# Patient Record
Sex: Female | Born: 1980 | Race: Black or African American | Hispanic: No | Marital: Single | Smoking: Former smoker
Health system: Southern US, Community
[De-identification: ages and names within clinical notes are randomized; demographics above are authoritative.]

## PROBLEM LIST (undated history)

## (undated) DIAGNOSIS — T8859XA Other complications of anesthesia, initial encounter: Secondary | ICD-10-CM

## (undated) DIAGNOSIS — I10 Essential (primary) hypertension: Secondary | ICD-10-CM

## (undated) DIAGNOSIS — T4145XA Adverse effect of unspecified anesthetic, initial encounter: Secondary | ICD-10-CM

## (undated) HISTORY — PX: FOOT SURGERY: SHX648

## (undated) HISTORY — PX: TONSILLECTOMY: SUR1361

## (undated) SURGERY — Surgical Case
Anesthesia: *Unknown

---

## 2010-05-18 ENCOUNTER — Emergency Department (HOSPITAL_BASED_OUTPATIENT_CLINIC_OR_DEPARTMENT_OTHER)
Admission: EM | Admit: 2010-05-18 | Discharge: 2010-05-18 | Payer: Self-pay | Source: Home / Self Care | Admitting: Emergency Medicine

## 2011-02-13 ENCOUNTER — Encounter: Payer: Self-pay | Admitting: *Deleted

## 2011-02-13 ENCOUNTER — Emergency Department (INDEPENDENT_AMBULATORY_CARE_PROVIDER_SITE_OTHER): Payer: Medicaid Other

## 2011-02-13 ENCOUNTER — Other Ambulatory Visit: Payer: Self-pay

## 2011-02-13 ENCOUNTER — Emergency Department (HOSPITAL_BASED_OUTPATIENT_CLINIC_OR_DEPARTMENT_OTHER)
Admission: EM | Admit: 2011-02-13 | Discharge: 2011-02-14 | Disposition: A | Discharge status: Home / Self Care | Payer: Medicaid Other | Attending: Emergency Medicine | Admitting: Emergency Medicine

## 2011-02-13 DIAGNOSIS — I10 Essential (primary) hypertension: Secondary | ICD-10-CM | POA: Insufficient documentation

## 2011-02-13 DIAGNOSIS — R0789 Other chest pain: Secondary | ICD-10-CM

## 2011-02-13 DIAGNOSIS — Z79899 Other long term (current) drug therapy: Secondary | ICD-10-CM | POA: Insufficient documentation

## 2011-02-13 DIAGNOSIS — M549 Dorsalgia, unspecified: Secondary | ICD-10-CM | POA: Insufficient documentation

## 2011-02-13 DIAGNOSIS — R079 Chest pain, unspecified: Secondary | ICD-10-CM

## 2011-02-13 DIAGNOSIS — I517 Cardiomegaly: Secondary | ICD-10-CM

## 2011-02-13 HISTORY — DX: Essential (primary) hypertension: I10

## 2011-02-13 LAB — D-DIMER, QUANTITATIVE: D-Dimer, Quant: 0.7 ug/mL-FEU — ABNORMAL HIGH (ref 0.00–0.48)

## 2011-02-13 LAB — PROTIME-INR
INR: 1.07 (ref 0.00–1.49)
Prothrombin Time: 14.1 seconds (ref 11.6–15.2)

## 2011-02-13 LAB — BASIC METABOLIC PANEL
CO2: 24 mEq/L (ref 19–32)
Calcium: 9.5 mg/dL (ref 8.4–10.5)
Chloride: 101 mEq/L (ref 96–112)
Potassium: 4.2 mEq/L (ref 3.5–5.1)
Sodium: 136 mEq/L (ref 135–145)

## 2011-02-13 LAB — CBC
Platelets: 289 10*3/uL (ref 150–400)
RBC: 4.52 MIL/uL (ref 3.87–5.11)
RDW: 13.1 % (ref 11.5–15.5)
WBC: 10.6 10*3/uL — ABNORMAL HIGH (ref 4.0–10.5)

## 2011-02-13 LAB — DIFFERENTIAL
Basophils Absolute: 0 10*3/uL (ref 0.0–0.1)
Eosinophils Relative: 1 % (ref 0–5)
Lymphocytes Relative: 51 % — ABNORMAL HIGH (ref 12–46)
Neutro Abs: 4.2 10*3/uL (ref 1.7–7.7)
Neutrophils Relative %: 39 % — ABNORMAL LOW (ref 43–77)

## 2011-02-13 LAB — CARDIAC PANEL(CRET KIN+CKTOT+MB+TROPI): CK, MB: 2.5 ng/mL (ref 0.3–4.0)

## 2011-02-13 MED ORDER — IOHEXOL 350 MG/ML SOLN
80.0000 mL | Freq: Once | INTRAVENOUS | Status: AC | PRN
  Administered 2011-02-13: 80 mL via INTRAVENOUS

## 2011-02-13 NOTE — ED Provider Notes (Signed)
History     CSN: [id number] Arrival date & time: 02/13/2011  8:23 PM  Chief Complaint  Patient presents with  . Chest Pain  . Back Pain    HPI  (Consider location/radiation/quality/duration/timing/severity/associated sxs/prior treatment)  Patient is a 30 y.o. female presenting with chest pain and back pain. The history is provided by the patient.  Chest Pain The chest pain began 3 - 5 hours ago. Chest pain occurs intermittently. The chest pain is unchanged. At its most intense, the pain is at 10/10. The pain is currently at 7/10. The quality of the pain is described as stabbing. The pain radiates to the upper back. Exacerbated by: nothing. Pertinent negatives for primary symptoms include no fever, no cough, no palpitations and no nausea.  Pertinent negatives for associated symptoms include no diaphoresis, no lower extremity edema and no numbness. Risk factors include sedentary lifestyle.  Her family medical history is significant for CAD in family. Family history comments: father mi age 88    Back Pain  Associated symptoms include chest pain. Pertinent negatives include no fever and no numbness.     Past Medical History  Diagnosis Date  . Hypertension     Past Surgical History  Procedure Date  . Joint replacement   . Tonsillectomy     History reviewed. No pertinent family history.  History  Substance Use Topics  . Smoking status: Never Smoker   . Smokeless tobacco: Not on file  . Alcohol Use: No    OB History    Grav Para Term Preterm Abortions TAB SAB Ect Mult Living                  Review of Systems  Review of Systems  Constitutional: Negative for fever and diaphoresis.  Respiratory: Negative for cough.   Cardiovascular: Positive for chest pain. Negative for palpitations.  Gastrointestinal: Negative for nausea.  Musculoskeletal: Positive for back pain.  Neurological: Negative for numbness.  All other systems reviewed and are negative.    Allergies    Review of patient's allergies indicates no known allergies.  Home Medications   Current Outpatient Rx  Name Route Sig Dispense Refill  . XANAX PO Oral Take 1 tablet by mouth 3 (three) times daily as needed. Anxiety      . WELLBUTRIN PO Oral Take 1 tablet by mouth daily.      Marland Kitchen FLUOXETINE HCL (PMDD) PO Oral Take 1 tablet by mouth daily.      Marland Kitchen LABETALOL HCL 200 MG PO TABS Oral Take 400 mg by mouth 2 (two) times daily.      Marland Kitchen NIFEDIPINE CR OSMOTIC 60 MG PO TB24 Oral Take 60 mg by mouth daily.      Marland Kitchen OLMESARTAN MEDOXOMIL-HCTZ 40-25 MG PO TABS Oral Take 1 tablet by mouth daily.        Physical Exam    BP 180/106  Pulse 69  Resp 16  Ht 5\' 8"  (1.727 m)  Wt 400 lb (181.439 kg)  BMI 60.82 kg/m2  SpO2 100%  LMP 02/13/2011  Physical Exam  Constitutional: She appears well-developed and well-nourished.  HENT:  Head: Atraumatic.  Eyes: Pupils are equal, round, and reactive to light.  Cardiovascular: Normal rate.   Pulmonary/Chest: Effort normal and breath sounds normal.  Musculoskeletal: She exhibits edema.  Neurological: She is alert.  Skin: Skin is warm.  Psychiatric: She has a normal mood and affect.    ED Course  Procedures (including critical care time)  Labs Reviewed -  No data to display Dg Chest 2 View  02/13/2011  *RADIOLOGY REPORT*  Clinical Data: Mid sternal chest pain  CHEST - 2 VIEW  Comparison: None.  Findings: Cardiomegaly with pulmonary vascular congestion.  No frank interstitial edema. No pleural effusion or pneumothorax.  Visualized osseous structures are within normal limits.  IMPRESSION: Cardiomegaly with pulmonary vascular congestion.  No frank interstitial edema.  Original Report Authenticated By: Charline Bills, M.D.     No diagnosis found.   MDM  Date: 02/13/2011  Rate: 87  Rhythm: normal sinus rhythm  QRS Axis: right  Intervals: normal  ST/T Wave abnormalities: normal  Conduction Disutrbances:none  Narrative Interpretation:   Old EKG  Reviewed: none available        Patient with mildly elevated d-dimer. She's had a CT angiogram of her chest. If this is normal she'll be discharged to home. Care discussed with  Dr. Jeraldine Loots and signed out.  Hilario Quarry, MD 02/14/11 [phone]

## 2011-02-13 NOTE — ED Notes (Signed)
Pt presents to ED today with midsternal CP that is is radiaiting to back that began approx 45 min ago.

## 2011-02-13 NOTE — ED Notes (Signed)
PT c/o mid sternal chest pain x 45 mins with SOB and back pain

## 2011-02-14 NOTE — ED Notes (Signed)
Pt had several questions with re: to pain control.  Explained and educated pt on test results and OTC Motrin for pain related issues.

## 2012-05-13 ENCOUNTER — Emergency Department (HOSPITAL_BASED_OUTPATIENT_CLINIC_OR_DEPARTMENT_OTHER)
Admission: EM | Admit: 2012-05-13 | Discharge: 2012-05-13 | Disposition: A | Discharge status: Home / Self Care | Payer: Medicaid Other | Attending: Emergency Medicine | Admitting: Emergency Medicine

## 2012-05-13 DIAGNOSIS — Z79899 Other long term (current) drug therapy: Secondary | ICD-10-CM | POA: Insufficient documentation

## 2012-05-13 DIAGNOSIS — Z3202 Encounter for pregnancy test, result negative: Secondary | ICD-10-CM | POA: Insufficient documentation

## 2012-05-13 DIAGNOSIS — I10 Essential (primary) hypertension: Secondary | ICD-10-CM | POA: Insufficient documentation

## 2012-05-13 DIAGNOSIS — I160 Hypertensive urgency: Secondary | ICD-10-CM

## 2012-05-13 DIAGNOSIS — R51 Headache: Secondary | ICD-10-CM | POA: Insufficient documentation

## 2012-05-13 LAB — URINALYSIS, ROUTINE W REFLEX MICROSCOPIC
Glucose, UA: NEGATIVE mg/dL
Hgb urine dipstick: NEGATIVE
Protein, ur: NEGATIVE mg/dL

## 2012-05-13 LAB — PREGNANCY, URINE: Preg Test, Ur: NEGATIVE

## 2012-05-13 MED ORDER — METOCLOPRAMIDE HCL 5 MG/ML IJ SOLN: 10.0000 mg | Freq: Once | INTRAMUSCULAR | Status: DC

## 2012-05-13 MED ORDER — LABETALOL HCL 100 MG PO TABS: 200.0000 mg | ORAL_TABLET | Freq: Two times a day (BID) | ORAL | Status: DC

## 2012-05-13 MED ORDER — SODIUM CHLORIDE 0.9 % IV BOLUS (SEPSIS): 1000.0000 mL | Freq: Once | INTRAVENOUS | Status: DC

## 2012-05-13 MED ORDER — KETOROLAC TROMETHAMINE 30 MG/ML IJ SOLN: 30.0000 mg | Freq: Once | INTRAMUSCULAR | Status: DC

## 2012-05-13 MED ORDER — KETOROLAC TROMETHAMINE 60 MG/2ML IM SOLN
60.0000 mg | Freq: Once | INTRAMUSCULAR | Status: AC
  Administered 2012-05-13: 60 mg via INTRAMUSCULAR
  Filled 2012-05-13: qty 2

## 2012-05-13 MED ORDER — ACETAMINOPHEN 500 MG PO TABS: 500.0000 mg | ORAL_TABLET | Freq: Four times a day (QID) | ORAL | Status: DC | PRN

## 2012-05-13 MED ORDER — METOCLOPRAMIDE HCL 5 MG/ML IJ SOLN
10.0000 mg | Freq: Once | INTRAMUSCULAR | Status: AC
  Administered 2012-05-13: 10 mg via INTRAMUSCULAR
  Filled 2012-05-13: qty 2

## 2012-05-13 MED ORDER — LABETALOL HCL 200 MG PO TABS
400.0000 mg | ORAL_TABLET | Freq: Once | ORAL | Status: DC
  Filled 2012-05-13: qty 2

## 2012-05-13 NOTE — ED Notes (Addendum)
Headache. Congestion x 3 days. States she has not taken her BP medication in a few days because her Rx has expired and her MDs office is closed due to the holidays. While she is here she would like make sure she is not going into kidney failure due to hx of untreated HTN for the past 7 years.

## 2012-05-15 NOTE — ED Provider Notes (Signed)
History     CSN: [id number]  Arrival date & time 05/13/12  1636   First MD Initiated Contact with Patient 05/13/12 1953      Chief Complaint  Patient presents with  . Headache    (Consider location/radiation/quality/duration/timing/severity/associated sxs/prior treatment) HPI Comments: Pt comes in with cc of headache. Pt has hx of HTN, and states that when her BP is elevated, she will frequently get headaches like she is right now. She is also out of her labetalol (taked 400 mg, bid). Pt has no No nausea, vomiting, visual complains, seizures, altered mental status, loss of consciousness, new weakness, or numbness, no gait instability. She is noted to be hypertensive, but there is no chest pain, sob. She does endorse seeing "spots" transiently when she  opens her eyes after closing them.  Patient is a 31 y.o. female presenting with headaches. The history is provided by the patient.  Headache  Pertinent negatives include no shortness of breath, no nausea and no vomiting.    Past Medical History  Diagnosis Date  . Hypertension     Past Surgical History  Procedure Date  . Joint replacement   . Tonsillectomy     No family history on file.  History  Substance Use Topics  . Smoking status: Never Smoker   . Smokeless tobacco: Not on file  . Alcohol Use: No    OB History    Grav Para Term Preterm Abortions TAB SAB Ect Mult Living                  Review of Systems  Constitutional: Negative for activity change.  HENT: Negative for neck pain and neck stiffness.   Respiratory: Negative for shortness of breath.   Cardiovascular: Negative for chest pain.  Gastrointestinal: Negative for nausea, vomiting and abdominal pain.  Genitourinary: Negative for dysuria.  Neurological: Positive for headaches.    Allergies  Review of patient's allergies indicates no known allergies.  Home Medications   Current Outpatient Rx  Name  Route  Sig  Dispense  Refill  .  ACETAMINOPHEN 500 MG PO TABS   Oral   Take 1 tablet (500 mg total) by mouth every 6 (six) hours as needed for pain.   30 tablet   0   . XANAX PO   Oral   Take 1 tablet by mouth 3 (three) times daily as needed. Anxiety           . WELLBUTRIN PO   Oral   Take 1 tablet by mouth daily.           Marland Kitchen FLUOXETINE HCL (PMDD) PO   Oral   Take 1 tablet by mouth daily.           Marland Kitchen LABETALOL HCL 100 MG PO TABS   Oral   Take 2 tablets (200 mg total) by mouth 2 (two) times daily.   30 tablet   0   . LABETALOL HCL 200 MG PO TABS   Oral   Take 400 mg by mouth 2 (two) times daily.           Marland Kitchen NIFEDIPINE ER OSMOTIC 60 MG PO TB24   Oral   Take 60 mg by mouth daily.           Marland Kitchen OLMESARTAN MEDOXOMIL-HCTZ 40-25 MG PO TABS   Oral   Take 1 tablet by mouth daily.             BP 170/112  Pulse 91  Temp 97.9 F (36.6 C) (Oral)  Resp 20  SpO2 100%  Physical Exam  Constitutional: She is oriented to person, place, and time. She appears well-developed and well-nourished.  HENT:  Head: Normocephalic and atraumatic.  Eyes: EOM are normal. Pupils are equal, round, and reactive to light.  Neck: Neck supple.  Cardiovascular: Normal rate, regular rhythm and normal heart sounds.   No murmur heard. Pulmonary/Chest: Effort normal. No respiratory distress.  Abdominal: Soft. She exhibits no distension. There is no tenderness. There is no rebound and no guarding.  Neurological: She is alert and oriented to person, place, and time.       Cerebellar exam is normal (finger to nose) Sensory exam normal for bilateral upper and lower extremities - and patient is able to discriminate between sharp and dull. Motor exam is 4+/5  Skin: Skin is warm and dry.    ED Course  Procedures (including critical care time)  Labs Reviewed  URINALYSIS, ROUTINE W REFLEX MICROSCOPIC - Abnormal; Notable for the following:    APPearance CLOUDY (*)     All other components within normal limits  PREGNANCY,  URINE   No results found.   1. Hypertensive urgency   2. Headache       MDM  Pt comes in with cc of headaches. Has had similar headaches in the past, and is describing the headache as a mild to moderate headache. She has taken no pain meds yet. No concerns for life threatening headaches based on hx and exam. She has elevated BP - but no sx of end organ damage except headache - which as described above is not concerning. Will give her a script for her labetalol and po pain meds.    Derwood Kaplan, MD 05/15/12 [phone]

## 2015-08-10 LAB — OB RESULTS CONSOLE HEPATITIS B SURFACE ANTIGEN: Hepatitis B Surface Ag: NEGATIVE

## 2015-08-17 LAB — OB RESULTS CONSOLE GC/CHLAMYDIA
Chlamydia: NEGATIVE
GC PROBE AMP, GENITAL: NEGATIVE

## 2015-08-17 LAB — OB RESULTS CONSOLE RUBELLA ANTIBODY, IGM: Rubella: IMMUNE

## 2015-08-17 LAB — OB RESULTS CONSOLE ANTIBODY SCREEN: Antibody Screen: NEGATIVE

## 2015-08-17 LAB — OB RESULTS CONSOLE HIV ANTIBODY (ROUTINE TESTING): HIV: NONREACTIVE

## 2015-08-17 LAB — OB RESULTS CONSOLE ABO/RH: RH TYPE: POSITIVE

## 2015-08-17 LAB — OB RESULTS CONSOLE RPR: RPR: NONREACTIVE

## 2016-01-19 ENCOUNTER — Encounter (HOSPITAL_COMMUNITY)
Admission: AD | Disposition: A | Discharge status: Home / Self Care | Payer: Self-pay | Source: Ambulatory Visit | Attending: Obstetrics

## 2016-01-19 ENCOUNTER — Inpatient Hospital Stay (HOSPITAL_COMMUNITY): Payer: Medicare Other

## 2016-01-19 ENCOUNTER — Inpatient Hospital Stay (HOSPITAL_COMMUNITY): Payer: Medicare Other | Admitting: Anesthesiology

## 2016-01-19 ENCOUNTER — Encounter (HOSPITAL_COMMUNITY): Payer: Self-pay | Admitting: *Deleted

## 2016-01-19 ENCOUNTER — Inpatient Hospital Stay (HOSPITAL_COMMUNITY)
Admission: AD | Admit: 2016-01-19 | Discharge: 2016-01-23 | DRG: 765 | Disposition: A | Discharge status: Home / Self Care | Payer: Medicare Other | Source: Ambulatory Visit | Attending: Obstetrics | Admitting: Obstetrics

## 2016-01-19 DIAGNOSIS — R0602 Shortness of breath: Secondary | ICD-10-CM

## 2016-01-19 DIAGNOSIS — O1413 Severe pre-eclampsia, third trimester: Secondary | ICD-10-CM

## 2016-01-19 DIAGNOSIS — O1410 Severe pre-eclampsia, unspecified trimester: Secondary | ICD-10-CM | POA: Diagnosis present

## 2016-01-19 DIAGNOSIS — O9989 Other specified diseases and conditions complicating pregnancy, childbirth and the puerperium: Secondary | ICD-10-CM

## 2016-01-19 DIAGNOSIS — Z3A33 33 weeks gestation of pregnancy: Secondary | ICD-10-CM | POA: Diagnosis not present

## 2016-01-19 DIAGNOSIS — O1002 Pre-existing essential hypertension complicating childbirth: Secondary | ICD-10-CM | POA: Diagnosis not present

## 2016-01-19 DIAGNOSIS — O99344 Other mental disorders complicating childbirth: Secondary | ICD-10-CM | POA: Diagnosis present

## 2016-01-19 DIAGNOSIS — F329 Major depressive disorder, single episode, unspecified: Secondary | ICD-10-CM | POA: Diagnosis not present

## 2016-01-19 DIAGNOSIS — O99214 Obesity complicating childbirth: Secondary | ICD-10-CM | POA: Diagnosis present

## 2016-01-19 DIAGNOSIS — Z6841 Body Mass Index (BMI) 40.0 and over, adult: Secondary | ICD-10-CM

## 2016-01-19 DIAGNOSIS — O10013 Pre-existing essential hypertension complicating pregnancy, third trimester: Secondary | ICD-10-CM

## 2016-01-19 DIAGNOSIS — O114 Pre-existing hypertension with pre-eclampsia, complicating childbirth: Principal | ICD-10-CM | POA: Diagnosis present

## 2016-01-19 HISTORY — DX: Other complications of anesthesia, initial encounter: T88.59XA

## 2016-01-19 HISTORY — DX: Adverse effect of unspecified anesthetic, initial encounter: T41.45XA

## 2016-01-19 LAB — COMPREHENSIVE METABOLIC PANEL
ALK PHOS: 79 U/L (ref 38–126)
ALT: 15 U/L (ref 14–54)
ANION GAP: 5 (ref 5–15)
AST: 16 U/L (ref 15–41)
Albumin: 2.8 g/dL — ABNORMAL LOW (ref 3.5–5.0)
BILIRUBIN TOTAL: 0.4 mg/dL (ref 0.3–1.2)
BUN: 11 mg/dL (ref 6–20)
CALCIUM: 8.9 mg/dL (ref 8.9–10.3)
CO2: 21 mmol/L — ABNORMAL LOW (ref 22–32)
Chloride: 109 mmol/L (ref 101–111)
Creatinine, Ser: 0.41 mg/dL — ABNORMAL LOW (ref 0.44–1.00)
GFR calc Af Amer: >60 mL/min (ref >60)
Glucose, Bld: 92 mg/dL (ref 65–99)
POTASSIUM: 3.9 mmol/L (ref 3.5–5.1)
Sodium: 135 mmol/L (ref 135–145)
TOTAL PROTEIN: 6.3 g/dL — AB (ref 6.5–8.1)

## 2016-01-19 LAB — TYPE AND SCREEN
ABO/RH(D): A POS
ANTIBODY SCREEN: NEGATIVE

## 2016-01-19 LAB — CBC
HCT: 31.8 % — ABNORMAL LOW (ref 36.0–46.0)
Hemoglobin: 11.2 g/dL — ABNORMAL LOW (ref 12.0–15.0)
MCH: 30.4 pg (ref 26.0–34.0)
MCHC: 35.2 g/dL (ref 30.0–36.0)
MCV: 86.2 fL (ref 78.0–100.0)
PLATELETS: 242 10*3/uL (ref 150–400)
RBC: 3.69 MIL/uL — ABNORMAL LOW (ref 3.87–5.11)
RDW: 13.2 % (ref 11.5–15.5)
WBC: 10.1 10*3/uL (ref 4.0–10.5)

## 2016-01-19 LAB — PROTEIN / CREATININE RATIO, URINE
CREATININE, URINE: 68 mg/dL
Protein Creatinine Ratio: 0.15 mg/mg{Cre} (ref 0.00–0.15)
TOTAL PROTEIN, URINE: 10 mg/dL

## 2016-01-19 SURGERY — Surgical Case
Anesthesia: Spinal

## 2016-01-19 SURGERY — Surgical Case
Anesthesia: Regional

## 2016-01-19 MED ORDER — FENTANYL CITRATE (PF) 100 MCG/2ML IJ SOLN
INTRAMUSCULAR | Status: AC
Start: 2016-01-19 — End: 2016-01-19
  Filled 2016-01-19: qty 2

## 2016-01-19 MED ORDER — PHENYLEPHRINE 8 MG IN D5W 100 ML (0.08MG/ML) PREMIX OPTIME
INJECTION | INTRAVENOUS | Status: DC | PRN
  Administered 2016-01-19: 60 ug/min via INTRAVENOUS

## 2016-01-19 MED ORDER — ONDANSETRON HCL 4 MG/2ML IJ SOLN
INTRAMUSCULAR | Status: AC
  Filled 2016-01-19: qty 2

## 2016-01-19 MED ORDER — MAGNESIUM SULFATE 50 % IJ SOLN
2.0000 g/h | INTRAVENOUS | Status: DC
  Administered 2016-01-19 – 2016-01-20 (×2): 2 g/h via INTRAVENOUS
  Filled 2016-01-19 (×2): qty 80

## 2016-01-19 MED ORDER — MORPHINE SULFATE (PF) 0.5 MG/ML IJ SOLN
INTRAMUSCULAR | Status: DC | PRN
  Administered 2016-01-19: .15 mg via INTRATHECAL

## 2016-01-19 MED ORDER — HYDROMORPHONE HCL 1 MG/ML IJ SOLN
INTRAMUSCULAR | Status: AC
  Filled 2016-01-19: qty 1

## 2016-01-19 MED ORDER — ONDANSETRON HCL 4 MG/2ML IJ SOLN
INTRAMUSCULAR | Status: DC | PRN
  Administered 2016-01-19: 4 mg via INTRAVENOUS

## 2016-01-19 MED ORDER — OXYTOCIN 10 UNIT/ML IJ SOLN
INTRAMUSCULAR | Status: AC
  Filled 2016-01-19: qty 4

## 2016-01-19 MED ORDER — SOD CITRATE-CITRIC ACID 500-334 MG/5ML PO SOLN
ORAL | Status: AC
Start: 2016-01-19 — End: 2016-01-19
  Administered 2016-01-19: 30 mL
  Filled 2016-01-19: qty 15

## 2016-01-19 MED ORDER — HYDRALAZINE HCL 20 MG/ML IJ SOLN
10.0000 mg | Freq: Once | INTRAMUSCULAR | Status: AC | PRN
  Administered 2016-01-19: 10 mg via INTRAVENOUS
  Filled 2016-01-19: qty 1

## 2016-01-19 MED ORDER — MAGNESIUM SULFATE BOLUS VIA INFUSION
4.0000 g | Freq: Once | INTRAVENOUS | Status: AC
  Administered 2016-01-19: 4 g via INTRAVENOUS
  Filled 2016-01-19: qty 500

## 2016-01-19 MED ORDER — DEXTROSE 5 % IV SOLN
INTRAVENOUS | Status: AC
  Filled 2016-01-19: qty 3000

## 2016-01-19 MED ORDER — DEXAMETHASONE SODIUM PHOSPHATE 10 MG/ML IJ SOLN
INTRAMUSCULAR | Status: DC | PRN
  Administered 2016-01-19: 10 mg via INTRAVENOUS

## 2016-01-19 MED ORDER — HYDRALAZINE HCL 20 MG/ML IJ SOLN
10.0000 mg | Freq: Once | INTRAMUSCULAR | Status: AC
  Administered 2016-01-19: 10 mg via INTRAVENOUS

## 2016-01-19 MED ORDER — DEXTROSE 5 % IV SOLN
INTRAVENOUS | Status: DC | PRN
  Administered 2016-01-19: 3 g via INTRAVENOUS

## 2016-01-19 MED ORDER — SCOPOLAMINE 1 MG/3DAYS TD PT72
MEDICATED_PATCH | TRANSDERMAL | Status: AC
  Filled 2016-01-19: qty 1

## 2016-01-19 MED ORDER — NIFEDIPINE 10 MG PO CAPS: 20.0000 mg | ORAL_CAPSULE | Freq: Once | ORAL | Status: DC

## 2016-01-19 MED ORDER — MORPHINE SULFATE-NACL 0.5-0.9 MG/ML-% IV SOSY
PREFILLED_SYRINGE | INTRAVENOUS | Status: AC
  Filled 2016-01-19: qty 1

## 2016-01-19 MED ORDER — HYDROMORPHONE HCL 1 MG/ML IJ SOLN
INTRAMUSCULAR | Status: DC | PRN
  Administered 2016-01-19: 1 mg via INTRAVENOUS

## 2016-01-19 MED ORDER — BETAMETHASONE SOD PHOS & ACET 6 (3-3) MG/ML IJ SUSP
12.0000 mg | INTRAMUSCULAR | Status: DC
  Administered 2016-01-19: 12 mg via INTRAMUSCULAR
  Filled 2016-01-19: qty 2

## 2016-01-19 MED ORDER — NIFEDIPINE 10 MG PO CAPS
10.0000 mg | ORAL_CAPSULE | Freq: Once | ORAL | Status: AC
  Administered 2016-01-19: 10 mg via ORAL
  Filled 2016-01-19: qty 1

## 2016-01-19 MED ORDER — LACTATED RINGERS IV SOLN
INTRAVENOUS | Status: DC
  Administered 2016-01-19: 18:00:00 via INTRAVENOUS

## 2016-01-19 MED ORDER — PHENYLEPHRINE 8 MG IN D5W 100 ML (0.08MG/ML) PREMIX OPTIME
INJECTION | INTRAVENOUS | Status: AC
  Filled 2016-01-19: qty 100

## 2016-01-19 MED ORDER — FENTANYL CITRATE (PF) 100 MCG/2ML IJ SOLN
INTRAMUSCULAR | Status: DC | PRN
  Administered 2016-01-19: 20 ug via INTRAVENOUS

## 2016-01-19 MED ORDER — LABETALOL HCL 5 MG/ML IV SOLN
20.0000 mg | INTRAVENOUS | Status: AC | PRN
  Administered 2016-01-19: 80 mg via INTRAVENOUS
  Administered 2016-01-19: 40 mg via INTRAVENOUS
  Administered 2016-01-19: 20 mg via INTRAVENOUS
  Filled 2016-01-19: qty 8
  Filled 2016-01-19: qty 16
  Filled 2016-01-19: qty 4

## 2016-01-19 MED ORDER — SCOPOLAMINE 1 MG/3DAYS TD PT72
MEDICATED_PATCH | TRANSDERMAL | Status: DC | PRN
  Administered 2016-01-19: 1 via TRANSDERMAL

## 2016-01-19 MED ORDER — BUPIVACAINE IN DEXTROSE 0.75-8.25 % IT SOLN
INTRATHECAL | Status: DC | PRN
  Administered 2016-01-19: 1.6 mL via INTRATHECAL

## 2016-01-19 SURGICAL SUPPLY — 35 items
CHLORAPREP W/TINT 26ML (MISCELLANEOUS) ×3 IMPLANT
CLAMP CORD UMBIL (MISCELLANEOUS) IMPLANT
CLOSURE WOUND 1/2 X4 (GAUZE/BANDAGES/DRESSINGS)
CLOTH BEACON ORANGE TIMEOUT ST (SAFETY) ×3 IMPLANT
CONTAINER PREFILL 10% NBF 15ML (MISCELLANEOUS) IMPLANT
DRESSING DISP NPWT PICO 4X12 (MISCELLANEOUS) ×3 IMPLANT
DRSG OPSITE POSTOP 4X10 (GAUZE/BANDAGES/DRESSINGS) ×3 IMPLANT
ELECT REM PT RETURN 9FT ADLT (ELECTROSURGICAL) ×3
ELECTRODE REM PT RTRN 9FT ADLT (ELECTROSURGICAL) ×1 IMPLANT
EXTRACTOR VACUUM M CUP 4 TUBE (SUCTIONS) IMPLANT
EXTRACTOR VACUUM M CUP 4' TUBE (SUCTIONS)
GLOVE BIO SURGEON STRL SZ 6.5 (GLOVE) ×2 IMPLANT
GLOVE BIO SURGEONS STRL SZ 6.5 (GLOVE) ×1
GLOVE BIOGEL PI IND STRL 7.0 (GLOVE) ×2 IMPLANT
GLOVE BIOGEL PI INDICATOR 7.0 (GLOVE) ×4
GOWN STRL REUS W/TWL LRG LVL3 (GOWN DISPOSABLE) ×6 IMPLANT
KIT ABG SYR 3ML LUER SLIP (SYRINGE) IMPLANT
NEEDLE HYPO 22GX1.5 SAFETY (NEEDLE) IMPLANT
NEEDLE HYPO 25X5/8 SAFETYGLIDE (NEEDLE) IMPLANT
NS IRRIG 1000ML POUR BTL (IV SOLUTION) ×3 IMPLANT
PACK C SECTION WH (CUSTOM PROCEDURE TRAY) ×3 IMPLANT
PAD OB MATERNITY 4.3X12.25 (PERSONAL CARE ITEMS) ×3 IMPLANT
PENCIL SMOKE EVAC W/HOLSTER (ELECTROSURGICAL) ×3 IMPLANT
STRIP CLOSURE SKIN 1/2X4 (GAUZE/BANDAGES/DRESSINGS) IMPLANT
SUT MON AB 4-0 PS1 27 (SUTURE) ×3 IMPLANT
SUT PLAIN 0 NONE (SUTURE) IMPLANT
SUT PLAIN 2 0 XLH (SUTURE) IMPLANT
SUT VIC AB 0 CT1 36 (SUTURE) ×6 IMPLANT
SUT VIC AB 0 CTX 36 (SUTURE) ×6
SUT VIC AB 0 CTX36XBRD ANBCTRL (SUTURE) ×3 IMPLANT
SUT VIC AB 2-0 CT1 27 (SUTURE) ×2
SUT VIC AB 2-0 CT1 TAPERPNT 27 (SUTURE) ×1 IMPLANT
SYR CONTROL 10ML LL (SYRINGE) IMPLANT
TOWEL OR 17X24 6PK STRL BLUE (TOWEL DISPOSABLE) ×3 IMPLANT
TRAY FOLEY CATH SILVER 14FR (SET/KITS/TRAYS/PACK) IMPLANT

## 2016-01-19 NOTE — Transfer of Care (Signed)
Immediate Anesthesia Transfer of Care Note  Patient: [name] Dunlap  Procedure(s) Performed: Procedure(s): CESAREAN SECTION (N/A)  Patient Location: PACU  Anesthesia Type:Spinal  Level of Consciousness: awake, alert  and oriented  Airway & Oxygen Therapy: Patient Spontanous Breathing  Post-op Assessment: Report given to RN and Post -op Vital signs reviewed and stable  Post vital signs: Reviewed and stable  Last Vitals:  Vitals:   01/19/16 2032 01/19/16 2033  BP:  (!) 199/99  Pulse: 78 85  Resp:  18  Temp:      Last Pain:  Vitals:   01/19/16 1636  TempSrc: Oral         Complications: No apparent anesthesia complications

## 2016-01-19 NOTE — Brief Op Note (Signed)
01/19/2016  11:39 PM  PATIENT:  [name] Dunlap  35 y.o. female  PRE-OPERATIVE DIAGNOSIS:  severe preeclampsia, uncontrolled hypertension, remote from delivery  POST-OPERATIVE DIAGNOSIS:  severe preeclampsia, uncontrolled hypertension, remote from delivery  PROCEDURE:  Procedure(s): CESAREAN SECTION (N/A)  Primary, LTCS with 2 layer closure  SURGEON:  Surgeon(s) and Role:    * Noland FordyceKelly Jernie Schutt, MD - Primary  PHYSICIAN ASSISTANT:   ASSISTANTSRenae Fickle: Paul, CNM   ANESTHESIA:   spinal  EBL:  Total I/O In: 566.7 [I.V.:566.7] Out: 1750 [Urine:950; Blood:800]  BLOOD ADMINISTERED:none  DRAINS: Urinary Catheter (Foley)   LOCAL MEDICATIONS USED:  NONE  SPECIMEN:  Source of Specimen:  placenta  DISPOSITION OF SPECIMEN:  L&D  COUNTS:  YES  TOURNIQUET:  * No tourniquets in log *  DICTATION: .Note written in EPIC  PLAN OF CARE: Admit to inpatient   PATIENT DISPOSITION:  PACU - hemodynamically stable.   Delay start of Pharmacological VTE agent (>24hrs) due to surgical blood loss or risk of bleeding: yes

## 2016-01-19 NOTE — H&P (Signed)
NAMJoyce Dunlap:  [name], [name]               ACCOUNT NO.:  [id number]  MEDICAL RECORD NO.:  [id number]  LOCATION:  9158                          FACILITY:  [facility]  PHYSICIAN:  Lenoard Adenichard J. Kalil Woessner, M.D.DATE OF BIRTH:  Jan 17, 1981  DATE OF ADMISSION:  01/19/2016 DATE OF DISCHARGE:                             HISTORY & PHYSICAL   ADMISSION DIAGNOSIS:  Severe hypertension.  HISTORY OF PRESENT ILLNESS:  A 35 year old, African American female, G1, P0, at 33 weeks and 1 day gestation who presented with severe hypertension in the office today.  The patient has a history of chronic hypertension, on labetalol.  She has a history of mild headache intermittently over the last 3 days.  She denies chest pain, shortness of breath, or epigastric pain.  She reports good fetal movement.  She had a normal growth ultrasound on January 06, 2016.  She has no known drug allergies.  Medications to include labetalol, Wellbutrin, and metformin.  She is a nonsmoker, nondrinker.  She denies domestic physical violence. Her prenatal course has been complicated by noncompliance with occasional prenatal visit blood pressures ranging in the 140s-150s/80s range.  No signs and symptoms of preeclampsia.  Normal ultrasound for interval growth and normal glucose testing.  PHYSICAL EXAMINATION:  GENERAL:  She is an obese, African American female, in no acute distress. HEENT:  Normal. NECK:  Supple.  Full range of motion. LUNGS:  Clear. HEART:  Regular rate and rhythm. ABDOMEN:  Soft, gravid, obese, nontender. PELVIC:  Deferred. EXTREMITIES:  There are no cords. NEUROLOGIC:  Nonfocal. SKIN:  Intact.  CBC is within normal limits.  CMP within normal limits.  Urine protein- creatinine ratio within normal limits.  Blood pressure elevations are severe ranging upon admission from 169-229 with diastolics ranging from 91-120.  IMPRESSION: 1. Thirty-three week and 1 day intrauterine pregnancy. 2. Morbid obesity. 3. Chronic  hypertension with severe exacerbation likely PEC with severe features based on BP criteria. ? Uncontrollable. Labs nl. 4. Depression, stable on Wellbutrin.  PLAN:  To admit.  Attempt acute control of severe hypertension.  Order BPP.  MFM consultation ordered.  Anesthesia consult ordered. Betamethasone given.  Magnesium sulfate started.  Hypertension protocol initiated.     Lenoard Adenichard J. Aaryn Parrilla, M.D.     RJT/MEDQ  D:  01/19/2016  T:  01/19/2016  Job:  [id number]

## 2016-01-19 NOTE — Op Note (Signed)
01/19/2016  11:39 PM  PATIENT:  [name] Dunlap  35 y.o. female  PRE-OPERATIVE DIAGNOSIS:  severe preeclampsia, uncontrolled hypertension, remote from delivery  POST-OPERATIVE DIAGNOSIS:  severe preeclampsia, uncontrolled hypertension, remote from delivery  PROCEDURE:  Procedure(s): CESAREAN SECTION (N/A)  Primary, LTCS with 2 layer closure  SURGEON:  Surgeon(s) and Role:    * Noland Fordyce, MD - Primary  PHYSICIAN ASSISTANT:   ASSISTANTSRenae Fickle, CNM   ANESTHESIA:   spinal  EBL:  Total I/O In: 566.7 [I.V.:566.7] Out: 1750 [Urine:950; Blood:800]  BLOOD ADMINISTERED:none  DRAINS: Urinary Catheter (Foley)   LOCAL MEDICATIONS USED:  NONE  SPECIMEN:  Source of Specimen:  placenta  DISPOSITION OF SPECIMEN:  L&D  COUNTS:  YES  TOURNIQUET:  * No tourniquets in log *  DICTATION: .Note written in EPIC  PLAN OF CARE: Admit to inpatient   PATIENT DISPOSITION:  PACU - hemodynamically stable.   Delay start of Pharmacological VTE agent (>24hrs) due to surgical blood loss or risk of bleeding: yes     Findings:  @BABYSEXEBC @ infant,  APGAR (1 MIN): 8   APGAR (5 MINS): 9   APGAR (10 MINS):   Normal uterus, tubes and ovaries, normal placenta. 3VC, clear amniotic fluid Multiple small SM and IM fibroids  EBL: 800 cc Antibiotics:  3g Ancef Complications: none  Indications: This is a 35 y.o. year-old, G1  At [redacted]w[redacted]d admitted for uncontrolled hypertension in the setting of known chronic hypertension. Pt had been well maintained with labetalol 200mg  po bid until presenting to office today with bps 220s/ 120s. Pt admitted for IV bp meds and evaluation for PEC. Despite multiple antihypertensives, we were unable to control her bp. 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 BMZ had been started earlier, Magnesium was started and after consultation with MFM, decision was made to proceed with  PCS. Risks benefits and alternatives of the procedure were discussed with the patient who agreed to proceed  Procedure:  After informed consent was obtained the patient was taken to the operating room where spinal anesthesia was initiated.  She was prepped and draped in the normal sterile fashion in dorsal supine position with a leftward tilt.  A foley catheter was placed. A Traxi pannus retractor was placed.  A Pfannenstiel skin incision was made 2 cm above the pubic symphysis in the midline with the scalpel.  Dissection was carried down with the Bovie cautery until the fascia was reached. The fascia was incised in the midline. The incision was extended laterally with the Mayo scissors. The inferior aspect of the fascial incision was grasped with the Coker clamps, elevated up and the underlying rectus muscles were dissected off sharply. The superior aspect of the fascial incision was grasped with the Coker clamps elevated up and the underlying rectus muscles were dissected off sharply.  The peritoneum was entered bluntly. The peritoneal incision was extended superiorly and inferiorly with good visualization of the bladder. The bladder blade was inserted and palpation was done to assess the fetal position and the location of the uterine vessels. The lower segment of the uterus was incised sharply with the scalpel and extended  bluntly in the cephalo-caudal fashion. The infant was grasped, brought to the incision,  rotated but the infant was unable to be delivered as the pannus was preventing [place] from delivery of fundal pressure. The uterine incision was extended slightly but still we were unable to  given fundal pressure. A Kiwi vacuum was placed but unable to obtain adequate suction. The manual vacuum cup was placed on the occiput, pressure applied and the head delivered easily with gentle traction, time <10 seconds and the vacuum removed. The remainder of the baby delivered without complication and spontaneous cry  noted.  The cord was clamped and cut after 1 mnute delay. The infant was handed off to the waiting pediatrician. The placenta was expressed. The uterus was left in situ. The uterus was cleared of all clots and debris. The uterine incision was repaired with 0 Vicryl in a running locked fashion.  A second layer of the same suture was used in an imbricating fashion to obtain excellent hemostasis. Several figure of 8 sutures were placed for hemostasis. Tubes and ovaries were evaluated and hemostatic. The gutters were cleared of all clots and debris. The uterine incision was reinspected and found to be hemostatic. The peritoneum was grasped and closed with 2-0 Vicryl in a running fashion. The cut muscle edges and the underside of the fascia were inspected and found to be hemostatic. The fascia was closed with 0 Vicryl in two halves . The subcutaneous tissue was irrigated. Scarpa's layer was closed with a 2-0 plain gut suture. The skin was closed with a 4-0 Monocryl in a single layer.  A PICO wound dressing was placed. The patient tolerated the procedure well. Sponge lap and needle counts were correct x3 and patient was taken to the recovery room in a stable condition.  Adom Schoeneck A. 01/19/2016 11:42 PM

## 2016-01-19 NOTE — Progress Notes (Signed)
See H&P by Dr. Billy Coastaavon. Care transferred to me.  G1 at 33'1 by early dating with acute onset elevated bps' unable to be controlled with standard meds, now meeting criteria for severe PEC. MFM consulting and recommends PCS. Pt started on Magnesium, BMZ given, anasthesia and NICU aware and have seen pt. Pt aware risks of c/s, particularly risks of wound infection given elevated BMI.   [name] Dunlap A. 01/19/2016 10:01 PM

## 2016-01-19 NOTE — Anesthesia Preprocedure Evaluation (Signed)
Anesthesia Evaluation  Patient identified by MRN, date of birth, ID band Patient awake    Reviewed: Allergy & Precautions, NPO status , Patient's Chart, lab work & pertinent test results  Airway Mallampati: III  TM Distance: >3 FB Neck ROM: Full    Dental no notable dental hx.    Pulmonary sleep apnea ,    Pulmonary exam normal        Cardiovascular hypertension (severe pre-eclampsia), Pt. on medications Normal cardiovascular exam     Neuro/Psych negative neurological ROS  negative psych ROS   GI/Hepatic negative GI ROS,   Endo/Other  Morbid obesity (super morbid obesity)  Renal/GU negative Renal ROS     Musculoskeletal negative musculoskeletal ROS (+)   Abdominal   Peds  Hematology  (+) anemia ,   Anesthesia Other Findings   Reproductive/Obstetrics (+) Pregnancy                             Anesthesia Physical Anesthesia Plan  ASA: III  Anesthesia Plan: Spinal   Post-op Pain Management:    Induction:   Airway Management Planned:   Additional Equipment:   Intra-op Plan:   Post-operative Plan:   Informed Consent:   Plan Discussed with:   Anesthesia Plan Comments:         Anesthesia Quick Evaluation

## 2016-01-19 NOTE — H&P (Signed)
History and physical dictated . MFM consult ordered. Case discussed with Dr. Sherrie Georgeecker. Anesthesia consult to be ordered.

## 2016-01-19 NOTE — Anesthesia Procedure Notes (Signed)
Spinal  Patient location during procedure: OR Start time: 01/19/2016 10:00 PM End time: 01/19/2016 10:05 PM Staffing Anesthesiologist: Bonita QuinGUIDETTI, Giomar Gusler S Performed: anesthesiologist  Preanesthetic Checklist Completed: patient identified, site marked, surgical consent, pre-op evaluation, timeout performed, IV checked, risks and benefits discussed and monitors and equipment checked Spinal Block Patient position: sitting Prep: DuraPrep Patient monitoring: cardiac monitor, heart rate, continuous pulse ox and blood pressure Approach: midline Location: L4-5 Injection technique: single-shot Needle Needle type: Sprotte  Needle gauge: 22 G

## 2016-01-20 ENCOUNTER — Encounter (HOSPITAL_COMMUNITY): Payer: Self-pay | Admitting: Obstetrics and Gynecology

## 2016-01-20 DIAGNOSIS — O1410 Severe pre-eclampsia, unspecified trimester: Secondary | ICD-10-CM | POA: Diagnosis present

## 2016-01-20 LAB — CBC
HCT: 33.5 % — ABNORMAL LOW (ref 36.0–46.0)
Hemoglobin: 11.6 g/dL — ABNORMAL LOW (ref 12.0–15.0)
MCH: 29.7 pg (ref 26.0–34.0)
MCHC: 34.6 g/dL (ref 30.0–36.0)
MCV: 85.9 fL (ref 78.0–100.0)
Platelets: 247 10*3/uL (ref 150–400)
RBC: 3.9 MIL/uL (ref 3.87–5.11)
RDW: 13.2 % (ref 11.5–15.5)
WBC: 15.9 10*3/uL — AB (ref 4.0–10.5)

## 2016-01-20 LAB — COMPREHENSIVE METABOLIC PANEL
ALBUMIN: 3.1 g/dL — AB (ref 3.5–5.0)
ALT: 17 U/L (ref 14–54)
ANION GAP: 7 (ref 5–15)
AST: 24 U/L (ref 15–41)
Alkaline Phosphatase: 79 U/L (ref 38–126)
BUN: 10 mg/dL (ref 6–20)
CHLORIDE: 106 mmol/L (ref 101–111)
CO2: 21 mmol/L — AB (ref 22–32)
Calcium: 8.4 mg/dL — ABNORMAL LOW (ref 8.9–10.3)
Creatinine, Ser: 0.44 mg/dL (ref 0.44–1.00)
GFR calc Af Amer: >60 mL/min (ref >60)
GFR calc non Af Amer: >60 mL/min (ref >60)
GLUCOSE: 149 mg/dL — AB (ref 65–99)
POTASSIUM: 4.3 mmol/L (ref 3.5–5.1)
SODIUM: 134 mmol/L — AB (ref 135–145)
Total Bilirubin: 0.5 mg/dL (ref 0.3–1.2)
Total Protein: 6.9 g/dL (ref 6.5–8.1)

## 2016-01-20 LAB — ABO/RH: ABO/RH(D): A POS

## 2016-01-20 LAB — MAGNESIUM: Magnesium: 3.9 mg/dL — ABNORMAL HIGH (ref 1.7–2.4)

## 2016-01-20 MED ORDER — ACETAMINOPHEN 500 MG PO TABS
1000.0000 mg | ORAL_TABLET | Freq: Four times a day (QID) | ORAL | Status: AC
  Administered 2016-01-20 (×2): 1000 mg via ORAL
  Filled 2016-01-20 (×2): qty 2

## 2016-01-20 MED ORDER — KETOROLAC TROMETHAMINE 30 MG/ML IJ SOLN
30.0000 mg | Freq: Four times a day (QID) | INTRAMUSCULAR | Status: AC | PRN
  Filled 2016-01-20: qty 1

## 2016-01-20 MED ORDER — WITCH HAZEL-GLYCERIN EX PADS: 1.0000 "application " | MEDICATED_PAD | CUTANEOUS | Status: DC | PRN

## 2016-01-20 MED ORDER — ONDANSETRON HCL 4 MG/2ML IJ SOLN: 4.0000 mg | Freq: Three times a day (TID) | INTRAMUSCULAR | Status: DC | PRN

## 2016-01-20 MED ORDER — METFORMIN HCL ER 500 MG PO TB24
500.0000 mg | ORAL_TABLET | Freq: Two times a day (BID) | ORAL | Status: DC
  Administered 2016-01-21 – 2016-01-23 (×4): 500 mg via ORAL
  Filled 2016-01-20 (×9): qty 1

## 2016-01-20 MED ORDER — NALBUPHINE HCL 10 MG/ML IJ SOLN: 5.0000 mg | Freq: Once | INTRAMUSCULAR | Status: DC | PRN

## 2016-01-20 MED ORDER — OXYCODONE HCL 5 MG PO TABS
5.0000 mg | ORAL_TABLET | ORAL | Status: DC | PRN
  Administered 2016-01-20 – 2016-01-21 (×3): 5 mg via ORAL
  Filled 2016-01-20 (×3): qty 1

## 2016-01-20 MED ORDER — ACETAMINOPHEN 325 MG PO TABS: 650.0000 mg | ORAL_TABLET | ORAL | Status: DC | PRN

## 2016-01-20 MED ORDER — DIPHENHYDRAMINE HCL 50 MG/ML IJ SOLN: 12.5000 mg | INTRAMUSCULAR | Status: DC | PRN

## 2016-01-20 MED ORDER — FENTANYL CITRATE (PF) 100 MCG/2ML IJ SOLN
25.0000 ug | Freq: Once | INTRAMUSCULAR | Status: AC
  Administered 2016-01-20: 50 ug via INTRAVENOUS

## 2016-01-20 MED ORDER — NALBUPHINE HCL 10 MG/ML IJ SOLN: 5.0000 mg | INTRAMUSCULAR | Status: DC | PRN

## 2016-01-20 MED ORDER — SENNOSIDES-DOCUSATE SODIUM 8.6-50 MG PO TABS
2.0000 | ORAL_TABLET | ORAL | Status: DC
  Administered 2016-01-20 – 2016-01-23 (×2): 2 via ORAL
  Filled 2016-01-20 (×6): qty 2

## 2016-01-20 MED ORDER — HYDRALAZINE HCL 20 MG/ML IJ SOLN: 10.0000 mg | Freq: Once | INTRAMUSCULAR | Status: DC | PRN

## 2016-01-20 MED ORDER — LABETALOL HCL 200 MG PO TABS
400.0000 mg | ORAL_TABLET | Freq: Three times a day (TID) | ORAL | Status: DC
  Administered 2016-01-20 – 2016-01-23 (×11): 400 mg via ORAL
  Filled 2016-01-20: qty 2
  Filled 2016-01-20 (×2): qty 4
  Filled 2016-01-20: qty 2
  Filled 2016-01-20: qty 4
  Filled 2016-01-20: qty 2
  Filled 2016-01-20: qty 4
  Filled 2016-01-20: qty 2
  Filled 2016-01-20 (×2): qty 4
  Filled 2016-01-20: qty 2
  Filled 2016-01-20: qty 4
  Filled 2016-01-20 (×5): qty 2
  Filled 2016-01-20: qty 4
  Filled 2016-01-20: qty 2
  Filled 2016-01-20: qty 4
  Filled 2016-01-20 (×2): qty 2
  Filled 2016-01-20: qty 4
  Filled 2016-01-20: qty 2

## 2016-01-20 MED ORDER — SODIUM CHLORIDE 0.9% FLUSH: 3.0000 mL | INTRAVENOUS | Status: DC | PRN

## 2016-01-20 MED ORDER — COCONUT OIL OIL
1.0000 | TOPICAL_OIL | Status: DC | PRN
Start: 2016-01-20 — End: 2016-01-23

## 2016-01-20 MED ORDER — DIPHENHYDRAMINE HCL 25 MG PO CAPS
25.0000 mg | ORAL_CAPSULE | ORAL | Status: DC | PRN
  Filled 2016-01-20: qty 1

## 2016-01-20 MED ORDER — PRENATAL MULTIVITAMIN CH
1.0000 | ORAL_TABLET | Freq: Every day | ORAL | Status: DC
  Administered 2016-01-20 – 2016-01-23 (×4): 1 via ORAL
  Filled 2016-01-20 (×4): qty 1

## 2016-01-20 MED ORDER — SIMETHICONE 80 MG PO CHEW
80.0000 mg | CHEWABLE_TABLET | Freq: Three times a day (TID) | ORAL | Status: DC
  Administered 2016-01-20 – 2016-01-23 (×10): 80 mg via ORAL
  Filled 2016-01-20 (×13): qty 1

## 2016-01-20 MED ORDER — DIPHENHYDRAMINE HCL 25 MG PO CAPS
25.0000 mg | ORAL_CAPSULE | Freq: Four times a day (QID) | ORAL | Status: DC | PRN
  Filled 2016-01-20: qty 1

## 2016-01-20 MED ORDER — LACTATED RINGERS IV SOLN
INTRAVENOUS | Status: DC
  Administered 2016-01-20 (×2): via INTRAVENOUS

## 2016-01-20 MED ORDER — OXYCODONE HCL 5 MG PO TABS
10.0000 mg | ORAL_TABLET | ORAL | Status: DC | PRN
  Administered 2016-01-20 – 2016-01-23 (×9): 10 mg via ORAL
  Filled 2016-01-20 (×9): qty 2

## 2016-01-20 MED ORDER — LABETALOL HCL 200 MG PO TABS
400.0000 mg | ORAL_TABLET | Freq: Three times a day (TID) | ORAL | Status: DC
  Filled 2016-01-20 (×2): qty 2

## 2016-01-20 MED ORDER — ACETAMINOPHEN 10 MG/ML IV SOLN
1000.0000 mg | Freq: Once | INTRAVENOUS | Status: AC
  Administered 2016-01-20: 1000 mg via INTRAVENOUS
  Filled 2016-01-20: qty 100

## 2016-01-20 MED ORDER — DEXTROSE 5 % IV SOLN
1.0000 ug/kg/h | INTRAVENOUS | Status: DC | PRN
  Filled 2016-01-20: qty 2

## 2016-01-20 MED ORDER — OXYTOCIN 40 UNITS IN LACTATED RINGERS INFUSION - SIMPLE MED: 2.5000 [IU]/h | INTRAVENOUS | Status: AC

## 2016-01-20 MED ORDER — SIMETHICONE 80 MG PO CHEW
80.0000 mg | CHEWABLE_TABLET | ORAL | Status: DC
  Administered 2016-01-20 – 2016-01-23 (×4): 80 mg via ORAL
  Filled 2016-01-20 (×6): qty 1

## 2016-01-20 MED ORDER — DIBUCAINE 1 % RE OINT: 1.0000 "application " | TOPICAL_OINTMENT | RECTAL | Status: DC | PRN

## 2016-01-20 MED ORDER — IBUPROFEN 600 MG PO TABS
600.0000 mg | ORAL_TABLET | Freq: Four times a day (QID) | ORAL | Status: DC
  Administered 2016-01-20 – 2016-01-23 (×15): 600 mg via ORAL
  Filled 2016-01-20 (×15): qty 1

## 2016-01-20 MED ORDER — SCOPOLAMINE 1 MG/3DAYS TD PT72: 1.0000 | MEDICATED_PATCH | Freq: Once | TRANSDERMAL | Status: DC

## 2016-01-20 MED ORDER — PROMETHAZINE HCL 25 MG/ML IJ SOLN: 6.2500 mg | INTRAMUSCULAR | Status: DC | PRN

## 2016-01-20 MED ORDER — SIMETHICONE 80 MG PO CHEW
80.0000 mg | CHEWABLE_TABLET | ORAL | Status: DC | PRN
  Filled 2016-01-20: qty 1

## 2016-01-20 MED ORDER — FENTANYL CITRATE (PF) 100 MCG/2ML IJ SOLN
INTRAMUSCULAR | Status: AC
  Filled 2016-01-20: qty 2

## 2016-01-20 MED ORDER — NIFEDIPINE ER OSMOTIC RELEASE 30 MG PO TB24
30.0000 mg | ORAL_TABLET | Freq: Every day | ORAL | Status: DC
  Administered 2016-01-20 – 2016-01-23 (×4): 30 mg via ORAL
  Filled 2016-01-20 (×4): qty 1

## 2016-01-20 MED ORDER — LABETALOL HCL 5 MG/ML IV SOLN
INTRAVENOUS | Status: AC
  Filled 2016-01-20: qty 4

## 2016-01-20 MED ORDER — MENTHOL 3 MG MT LOZG
1.0000 | LOZENGE | OROMUCOSAL | Status: DC | PRN
  Filled 2016-01-20: qty 9

## 2016-01-20 MED ORDER — NALOXONE HCL 0.4 MG/ML IJ SOLN: 0.4000 mg | INTRAMUSCULAR | Status: DC | PRN

## 2016-01-20 MED ORDER — LABETALOL HCL 5 MG/ML IV SOLN
20.0000 mg | INTRAVENOUS | Status: AC | PRN
  Administered 2016-01-20: 40 mg via INTRAVENOUS
  Administered 2016-01-20: 80 mg via INTRAVENOUS
  Administered 2016-01-20: 20 mg via INTRAVENOUS
  Filled 2016-01-20: qty 4
  Filled 2016-01-20: qty 16
  Filled 2016-01-20: qty 8

## 2016-01-20 MED ORDER — MEPERIDINE HCL 25 MG/ML IJ SOLN: 6.2500 mg | INTRAMUSCULAR | Status: DC | PRN

## 2016-01-20 MED ORDER — TETANUS-DIPHTH-ACELL PERTUSSIS 5-2.5-18.5 LF-MCG/0.5 IM SUSP
0.5000 mL | Freq: Once | INTRAMUSCULAR | Status: DC
  Filled 2016-01-20: qty 0.5

## 2016-01-20 NOTE — Progress Notes (Signed)
POD#1 PCS at 33 wks for uncontrolled htn  Pt reports feeling great. No HA, no vision change, no blurry vision, no scotomata, no RUQ pain. Incision pain well controlled. Minimal vaginal bleeding. Still on bed rest. No SOB. No CP, pt reports breathing much better than prior to c/s.   O;  Vitals:   01/20/16 1032 01/20/16 1307 01/20/16 1328 01/20/16 1335  BP: (!) 157/82 (!) 169/94 (!) 171/84 (!) 157/83  Pulse: 68 73 73 71  Resp:  16    Temp:      TempSrc:      SpO2:      Weight:      Height:        Gen: well appearing, no distress CV: RRR Pulm: CTAB Abd: obese, appropriately tender, no RUQ pain, unable to palpate fundus due to habitus Inc: under pannus, dressed, no staining, PICO suction dressing in place LE: 2+ edema, 2+ DTR, no clonus  CBC    Component Value Date/Time   WBC 15.9 (H) 01/20/2016 0532   RBC 3.90 01/20/2016 0532   HGB 11.6 (L) 01/20/2016 0532   HCT 33.5 (L) 01/20/2016 0532   PLT 247 01/20/2016 0532   MCV 85.9 01/20/2016 0532   MCH 29.7 01/20/2016 0532   MCHC 34.6 01/20/2016 0532   RDW 13.2 01/20/2016 0532   LYMPHSABS 5.5 (H) 02/13/2011 2235   MONOABS 0.8 02/13/2011 2235   EOSABS 0.1 02/13/2011 2235   BASOSABS 0.0 02/13/2011 2235    CMP     Component Value Date/Time   NA 134 (L) 01/20/2016 0532   K 4.3 01/20/2016 0532   CL 106 01/20/2016 0532   CO2 21 (L) 01/20/2016 0532   GLUCOSE 149 (H) 01/20/2016 0532   BUN 10 01/20/2016 0532   CREATININE 0.44 01/20/2016 0532   CALCIUM 8.4 (L) 01/20/2016 0532   PROT 6.9 01/20/2016 0532   ALBUMIN 3.1 (L) 01/20/2016 0532   AST 24 01/20/2016 0532   ALT 17 01/20/2016 0532   ALKPHOS 79 01/20/2016 0532   BILITOT 0.5 01/20/2016 0532   GFRNONAA >60 01/20/2016 0532   GFRAA >60 01/20/2016 0532   A/P: POD#1 s/p PCS for uncontrolled htn c/w chronic htn with superimposed severe PEC. Pt without any lab abnormalities or proteinuria, no symptoms of PEC and bp under better control. Elevations still noted and will add  procardia 30XL now. Continue Mag until 24 hrs post-op and awaiting adequate diuresis. Continue to search for adequate size/ shape bp cuff for more accurate measurements.   Rodney Yera A. 01/20/2016 2:15 PM

## 2016-01-20 NOTE — Anesthesia Postprocedure Evaluation (Signed)
Anesthesia Post Note  Patient: [name] Dunlap  Procedure(s) Performed: Procedure(s) (LRB): CESAREAN SECTION (N/A)  Patient location during evaluation: Mother Baby Anesthesia Type: Spinal Level of consciousness: awake and alert and oriented Pain management: satisfactory to patient Vital Signs Assessment: post-procedure vital signs reviewed and stable Respiratory status: spontaneous breathing and nonlabored ventilation Cardiovascular status: stable Postop Assessment: no headache, no backache, patient able to bend at knees, no signs of nausea or vomiting and adequate PO intake Anesthetic complications: no     Last Vitals:  Vitals:   01/20/16 0803 01/20/16 0807  BP: (!) 170/88 (!) 160/90  Pulse: 70 69  Resp:    Temp:      Last Pain:  Vitals:   01/20/16 0747  TempSrc:   PainSc: 0-No pain   Pain Goal:                 [name] Dunlap,[name] Dunlap

## 2016-01-20 NOTE — Anesthesia Postprocedure Evaluation (Signed)
Anesthesia Post Note  Patient: [name] Dunlap  Procedure(s) Performed: Procedure(s) (LRB): CESAREAN SECTION (N/A)  Patient location during evaluation: PACU Anesthesia Type: Spinal Level of consciousness: oriented and awake and alert Pain management: pain level controlled Vital Signs Assessment: post-procedure vital signs reviewed and stable Respiratory status: spontaneous breathing, respiratory function stable and patient connected to nasal cannula oxygen Cardiovascular status: blood pressure returned to baseline and stable Postop Assessment: no headache and no backache Anesthetic complications: no     Last Vitals:  Vitals:   01/20/16 0015 01/20/16 0030  BP: (!) 168/91   Pulse: 75   Resp: 15   Temp:  36.6 C    Last Pain:  Vitals:   01/20/16 0030  TempSrc: Oral   Pain Goal:                 [name]

## 2016-01-20 NOTE — Progress Notes (Signed)
Patient screened out for psychosocial assessment since none of the following apply:  Psychosocial stressors documented in mother or baby's chart  Gestation less than 32 weeks  Code at delivery   Infant with anomalies Please contact the Clinical Social Worker if specific needs arise, or by MOB's request.   MOB was referred for history of depression/anxiety. * Referral screened out by Clinical Social Worker because none of the following criteria appear to apply: ~ History of anxiety/depression during this pregnancy, or of post-partum depression. ~ Diagnosis of anxiety and/or depression within last 3 years OR * MOB's symptoms currently being treated with medication and/or therapy. Please contact the Clinical Social Worker if needs arise, or if MOB requests.  Kadrian Partch Boyd-Gilyard, MSW, LCSW Clinical Social Work [phone] 

## 2016-01-20 NOTE — Lactation Note (Signed)
This note was copied from a baby's chart. Lactation Consultation Note  Patient Name: [name] Joyce CopaLakeya Mathey RUEAV'WToday's Date: 01/20/2016 Reason for consult: Initial assessment;NICU baby  NICU booklet (Mom shown pumping log, chart for pumping frequency, & storage guidelines).  Mom assisted w/hand expression & a small amount was able to be put into a colostrum container (colostrum stickers provided).   On visual inspection, size 24 flange appropriate for nipple diameter. Mom reports having already pumped 6 times since birth, but only getting drop(s) on the flanges. I recommended that Mom put the flanges in a bag and take to NICU so RN can swab and use for oral care. I encouraged Mom to pump q3h and follow w/hand expression.  Mom is taking labetalol (L2) 400mg  q8h & metformin (L1) 500mg  bid. Mom's hx also includes mention of bupropion (L3).   Lurline HareRichey, Kenidy Crossland [facility] 01/20/2016, 9:49 PM

## 2016-01-21 MED ORDER — HYDROCHLOROTHIAZIDE 25 MG PO TABS
25.0000 mg | ORAL_TABLET | Freq: Every day | ORAL | Status: DC
  Administered 2016-01-21 – 2016-01-23 (×3): 25 mg via ORAL
  Filled 2016-01-21 (×4): qty 1

## 2016-01-21 NOTE — Progress Notes (Signed)
POSTOPERATIVE DAY # 2 S/P CS - 33 weeks / preeclampsia   S:         Reports feeling better today -no PIH symptoms             Tolerating po intake / no nausea / no vomiting / some flatus / no BM             Bleeding is moderate at times - few small clots             Pain controlled with motrin and oxycodone             Up ad lib / ambulatory/ voiding QS  Newborn in NICU   O:  VS: BP (!) 159/98 (BP Location: Left Arm) Comment: after ambulating in the room  Pulse 78   Temp 98.2 F (36.8 C) (Oral)   Resp (!) 22   Ht 5' 7.5" (1.715 m)   Wt (!) 169.8 kg (374 lb 6.4 oz)   SpO2 98%   Breastfeeding? Unknown   BMI 57.77 kg/m     BP range 129/65 - 143/65 - 152/87  (previously noted discrepancy in BP ranges with cuff sizes - reminder for staff to use large cuff based on weight / arm size)   LABS:               Recent Labs  01/19/16 1710 01/20/16 0532  WBC 10.1 15.9*  HGB 11.2* 11.6*  PLT 242 247               Bloodtype: --/--/A POS, A POS (08/31 1710)  Rubella: Immune (03/29 0000)                                             I&O: Intake/Output      09/01 0701 - 09/02 0700 09/02 0701 - 09/03 0700   P.O. 3048    I.V. (mL/kg) 2829.2 (16.9)    IV Piggyback     Total Intake(mL/kg) 5877.2 (35)    Urine (mL/kg/hr) 3255 (0.8)    Blood     Total Output 3255     Net +2622.2            Weight UP 169.8kg today from 167.9kg yesterday.             Physical Exam:             Alert and Oriented X3  Lungs: Clear and unlabored  Heart: regular rate and rhythm / no mumurs  Abdomen: pendulous, soft, non-tender, non-distended             Fundus: firm, non-tender, Ueven             Dressing intact             Incision: no erythema / no ecchymosis / no drainage  Lochia: scant  just out of shower)  Extremities: 2+ edema, no calf pain or tenderness  A:        POD # 2S/P CS            Morbidly obese            Preeclampsia - no diuresis yet            Glucose intolerance   P:         Routine postoperative care  Add HCTZ today - continue I&O / weights             Check FBS     [name] Dunlap, [name] Dunlap CNM, MSN, [facility] 01/21/2016, 0850am

## 2016-01-21 NOTE — Lactation Note (Signed)
This note was copied from a baby's chart. Lactation Consultation Note  Patient Name: [name] Dunlap WJXBJ'YToday's Date: 01/21/2016 Reason for consult: Follow-up assessment;NICU baby;Infant < 6lbs   Follow up with mom of 4 hour old NICU infant. Mom reports she knows she is to be pumping every 3 hours bu thas been in a lot of pain today. She says she feels better and is off her MgSO4. Enc her to pump as often as she can and to hand express post pumping. Mom voiced she is able to hand express herself. Mom was pleased she was able to visit infant today for about an hour. Enc mom to pump after seeing infant. Mom voiced understanding. Follow up tomorrow and prn.    Maternal Data Formula Feeding for Exclusion: No Has patient been taught Hand Expression?: Yes  Feeding Feeding Type: Formula Length of feed: 30 min  LATCH Score/Interventions                      Lactation Tools Discussed/Used Pump Review: Setup, frequency, and cleaning Initiated by:: Reviewed   Consult Status Consult Status: Follow-up Date: 01/22/16 Follow-up type: In-patient    Silas FloodSharon S Thales Knipple 01/21/2016, 6:12 PM

## 2016-01-22 LAB — COMPREHENSIVE METABOLIC PANEL
ALT: 16 U/L (ref 14–54)
AST: 17 U/L (ref 15–41)
Albumin: 2.6 g/dL — ABNORMAL LOW (ref 3.5–5.0)
Alkaline Phosphatase: 64 U/L (ref 38–126)
Anion gap: 4 — ABNORMAL LOW (ref 5–15)
BUN: 19 mg/dL (ref 6–20)
CO2: 22 mmol/L (ref 22–32)
Calcium: 8.1 mg/dL — ABNORMAL LOW (ref 8.9–10.3)
Chloride: 110 mmol/L (ref 101–111)
Creatinine, Ser: 0.53 mg/dL (ref 0.44–1.00)
GFR calc Af Amer: >60 mL/min (ref >60)
GFR calc non Af Amer: >60 mL/min (ref >60)
Glucose, Bld: 97 mg/dL (ref 65–99)
Potassium: 4.2 mmol/L (ref 3.5–5.1)
Sodium: 136 mmol/L (ref 135–145)
Total Bilirubin: 0.4 mg/dL (ref 0.3–1.2)
Total Protein: 5.9 g/dL — ABNORMAL LOW (ref 6.5–8.1)

## 2016-01-22 LAB — CBC
HCT: 28.3 % — ABNORMAL LOW (ref 36.0–46.0)
Hemoglobin: 9.4 g/dL — ABNORMAL LOW (ref 12.0–15.0)
MCH: 29.6 pg (ref 26.0–34.0)
MCHC: 33.2 g/dL (ref 30.0–36.0)
MCV: 89 fL (ref 78.0–100.0)
Platelets: 229 10*3/uL (ref 150–400)
RBC: 3.18 MIL/uL — ABNORMAL LOW (ref 3.87–5.11)
RDW: 13.9 % (ref 11.5–15.5)
WBC: 12 10*3/uL — ABNORMAL HIGH (ref 4.0–10.5)

## 2016-01-22 MED ORDER — NIFEDIPINE ER OSMOTIC RELEASE 30 MG PO TB24: 30.0000 mg | ORAL_TABLET | Freq: Every day | ORAL | Status: DC

## 2016-01-22 MED ORDER — HYDRALAZINE HCL 10 MG PO TABS
10.0000 mg | ORAL_TABLET | Freq: Three times a day (TID) | ORAL | Status: DC
  Administered 2016-01-22 – 2016-01-23 (×3): 10 mg via ORAL
  Filled 2016-01-22 (×6): qty 1

## 2016-01-22 NOTE — Progress Notes (Signed)
POSTOPERATIVE DAY # 3 S/P CS @ 33 weeks / severe preeclampsia   S:         Reports feeling ok - no PIH symptoms / thinks edema little better             Tolerating po intake / no nausea / no vomiting / + flatus / no BM             Bleeding is light             Pain controlled with motrin and percocet             Up ad lib / ambulatory/ voiding QS  Newborn stable in NICU - pumping for breast feeding  -states milk in today   O: VS: BP (!) 151/61   Pulse 77   Temp 98.4 F (36.9 C)   Resp (!) 22   Ht 5' 7.5" (1.715 m)   Wt (!) 172.6 kg (380 lb 9.6 oz) Comment: RN notified  SpO2 98%   Breastfeeding? Unknown   BMI 58.73 kg/m    BP range: 151/61 - 155/74 - 155/94 - 142/83   No CBG this am             Weight:  increased today to 172kg from 169kg                   I&O: improved with increased outpt           Only net + 797  Physical Exam:             Alert and Oriented X3  Lungs: Clear and unlabored  Heart: regular rate and rhythm / no mumurs  Abdomen: soft, non-tender, non-distended, panus soft with erythema or edema             Fundus: firm, non-tender, Ueven             Dressing intact              Incision:  no erythema / no ecchymosis / no drainage  Perineum: intact  Lochia: light  Extremities: 2+ edema, no calf pain or tenderness, negative Homans  A:        POD # 3 S/P CS            Severe preeclampsia - improving / labile HTN            Morbidly obese            Glucose intolerance  P:        Routine postoperative care              Repeat labs today - continue current management              Anticipate DC tomorrow if remains stable              Dr Cherly Hensenousins updated     Marlinda MikeBAILEY, Suesan Mohrmann CNM, MSN, [facility] 01/22/2016, 10:46 AM

## 2016-01-22 NOTE — Lactation Note (Addendum)
This note was copied from a baby's chart. Lactation Consultation Note  Patient Name: [name] Dunlap ZOXWR'UToday's Date: 01/22/2016 Reason for consult: Follow-up assessment;NICU baby;Infant < 6lbs   Follow up with mom of 67 hour old NICU infant. Mom reports she is pumping, but not every 3 hours. She reports she has been very sleepy today. She is getting 3-4 oz/pumping. Enc her to pump every 2-3 hours to promote milk supply and to prevent engorgement. Discussed engorgement and mom reports she is worried about getting that as she is always leaking. Reviewed supply and demand also. Advised mom to switch to the maintenance setting on the DEBP.   She reports she fed the infant a bottle earlier and she took 22 cc, mom was very happy.   Discussed WIC loaner with mom for day of d/c. [place] and often is able to get same day appointments, enc her to call them Tuesday Morning. Mom without questions/concerns at this time.    Maternal Data    Feeding    LATCH Score/Interventions                      Lactation Tools Discussed/Used WIC Program: Yes [facility]([place] office) Pump Review: Setup, frequency, and cleaning   Consult Status Consult Status: Follow-up Date: 01/23/16 Follow-up type: In-patient    Silas FloodSharon S Altariq Goodall 01/22/2016, 6:39 PM

## 2016-01-23 LAB — GLUCOSE, CAPILLARY: Glucose-Capillary: 74 mg/dL (ref 65–99)

## 2016-01-23 MED ORDER — LABETALOL HCL 200 MG PO TABS: 400.0000 mg | ORAL_TABLET | Freq: Three times a day (TID) | ORAL | 1 refills | Status: AC

## 2016-01-23 MED ORDER — IRON POLYSACCH CMPLX-B12-FA 150-0.025-1 MG PO CAPS: 1.0000 | ORAL_CAPSULE | Freq: Every day | ORAL | 0 refills | Status: AC

## 2016-01-23 MED ORDER — NIFEDIPINE ER 30 MG PO TB24: 30.0000 mg | ORAL_TABLET | Freq: Every day | ORAL | 0 refills | Status: DC

## 2016-01-23 MED ORDER — HYDRALAZINE HCL 10 MG PO TABS: 10.0000 mg | ORAL_TABLET | Freq: Three times a day (TID) | ORAL | 0 refills | Status: AC

## 2016-01-23 MED ORDER — HYDROCHLOROTHIAZIDE 25 MG PO TABS: 25.0000 mg | ORAL_TABLET | Freq: Every day | ORAL | 0 refills | Status: DC

## 2016-01-23 MED ORDER — IBUPROFEN 600 MG PO TABS: 600.0000 mg | ORAL_TABLET | Freq: Four times a day (QID) | ORAL | 0 refills | Status: DC

## 2016-01-23 MED ORDER — SODIUM CHLORIDE 0.9 % IV SOLN
8.0000 mg | Freq: Three times a day (TID) | INTRAVENOUS | Status: DC | PRN
  Filled 2016-01-23: qty 4

## 2016-01-23 MED ORDER — OXYCODONE HCL 5 MG PO TABS: 5.0000 mg | ORAL_TABLET | ORAL | 0 refills | Status: DC | PRN

## 2016-01-23 MED ORDER — ONDANSETRON HCL 4 MG/2ML IJ SOLN: 8.0000 mg | Freq: Three times a day (TID) | INTRAMUSCULAR | Status: DC | PRN

## 2016-01-23 MED ORDER — MAGNESIUM 400 MG PO TABS: 400.0000 mg | ORAL_TABLET | Freq: Every day | ORAL | 1 refills | Status: AC

## 2016-01-23 NOTE — Lactation Note (Signed)
This note was copied from a baby's chart. Lactation Consultation Note  Patient Name: [name] ZOXWR'UToday's Date: 01/23/2016  Mom states her milk is in and she is obtaining abundant amounts of milk.  We discussed a loaner pump if she is discharged today.  She is active with WIC in [place].  No questiions or concerns at present.  Encouraged to call if loaner pump is desired.   Maternal Data    Feeding    LATCH Score/Interventions                      Lactation Tools Discussed/Used     Consult Status      Huston FoleyMOULDEN, Lashaun Poch S 01/23/2016, 9:03 AM

## 2016-01-23 NOTE — Progress Notes (Signed)
Pt states  Understanding with all d/c instructions that were previously given, copy of instructions remains with pt. Pt is pumping and waiting for ride.

## 2016-01-23 NOTE — Progress Notes (Signed)
POSTOPERATIVE DAY # 4 S/P CS - severe preeclampsia   S:         Reports feeling well             Tolerating po intake / no nausea / no vomiting / + flatus / + BM             Bleeding is light             Pain controlled with motrin and oxycodone             Up ad lib / ambulatory/ voiding QS  Newborn stable in NICU - breast feeding   O:  VS: BP (!) 157/86   Pulse 78   Temp 98.1 F (36.7 C) (Oral)   Resp 16   Ht 5' 7.5" (1.715 m)   Wt (!) 170.8 kg (376 lb 8 oz)   SpO2 98%   Breastfeeding? Unknown   BMI 58.10 kg/m    LABS:  fasting CBG - 74                          SGOT  17 / SGPT  16              Recent Labs  01/22/16 1147  WBC 12.0*  HGB 9.4*  PLT 229               Bloodtype: --/--/A POS, A POS (08/31 1710)  Rubella: Immune (03/29 0000)                                          I&O:  negative 550ml             Physical Exam:             Alert and Oriented X3  Lungs: Clear and unlabored  Heart: regular rate and rhythm / no mumurs  Abdomen: soft, non-tender, non-distended / panus soft without edema or erythema             Fundus: firm, non-tender, Ueven             Dressing intact (wound vac in place)              Incision: no erythema / no ecchymosis / no drainage  Perineum: intact  Lochia: light  Extremities: 1+ edema, no calf pain or tenderness, negative Homans  A:        POD # 4 S/P CS            Severe preeclampsia - stable            Morbidly obese             Glucose intolerance - stable  P:        Routine postoperative care              DC home     Marlinda MikeBAILEY, Maleaha Hughett CNM, MSN, [facility] 01/23/2016, 9:01 AM

## 2016-01-23 NOTE — Progress Notes (Signed)
Patient's blood pressures ranging 170s-190s/80s-100s; Dr Billy Coastaavon aware. Patient asymptomic, denies HA or blurry vision. Multiple blood pressure cuffs used on upper arm, but unable to find an appropriate cuff size. Able to measure in BP using extra long/extra large cuff on lower arm. Will continue to monitor patient closely.

## 2016-01-23 NOTE — Discharge Summary (Signed)
OB Discharge Summary  Patient Name: [name] Dunlap DOB: Aug 05, 1980 MRN: [id number]  Date of admission: 01/19/2016  Admitting diagnosis: 33wks HBP Intrauterine pregnancy: [redacted]w[redacted]d     Secondary diagnosis: Preeclampsia and obesity   Date of discharge: 01/23/2016    Discharge diagnosis: Severe preeclampsia at 33 weeks / POD 4 s/p cesarean section    Prenatal history: G1P0100   EDC : 03/07/2016, by Other Basis  Pren[facility] care at [facility]  Prenatal course complicated by obesity / hypertension / preterm labor  Prenatal Labs:  Antibody: NEG (08/31 1710) Rubella: Immune (03/29 0000)   RPR: Nonreactive (03/29 0000)  HBsAg: Negative (03/22 0000)  HIV: Non-reactive (03/29 0000)                                 Hospital course: admit with severe preeclampsia and uncontrolled hypertension, remote from delivery                                Decision for cesarean delivery                               Magnesium sulfate prophylaxis / anti-hypertensive agents / diuretic  Delivering PROVIDER: Noland Fordyce                                                            Complications: None  Newborn Data: Live born female  Birth Weight: 4 lb 5.1 oz (1960 g) APGAR: 8, 9  Baby Feeding: Bottle and Breast Disposition:NICU  Post partum procedures:none  Postpartum contraception: Not Discussed    Labs: Lab Results  Component Value Date   WBC 12.0 (H) 01/22/2016   HGB 9.4 (L) 01/22/2016   HCT 28.3 (L) 01/22/2016   MCV 89.0 01/22/2016   PLT 229 01/22/2016   CMP Latest Ref Rng & Units 01/22/2016  Glucose 65 - 99 mg/dL 97  BUN 6 - 20 mg/dL 19  Creatinine 2.13 - 0.86 mg/dL 5.78  Sodium 469 - 629 mmol/L 136  Potassium 3.5 - 5.1 mmol/L 4.2  Chloride 101 - 111 mmol/L 110  CO2 22 - 32 mmol/L 22  Calcium 8.9 - 10.3 mg/dL 8.1(L)  Total Protein 6.5 - 8.1 g/dL 5.9(L)  Total Bilirubin 0.3 - 1.2 mg/dL 0.4  Alkaline Phos 38 - 126 U/L 64  AST 15 - 41 U/L 17  ALT 14 - 54  U/L 16    Physical Exam @ time of discharge:  Vitals:   01/23/16 0835 01/23/16 0929 01/23/16 0950 01/23/16 1003  BP:  (!) 187/93 (!) 182/98 (!) 174/89  Pulse:  77 71 71  Resp:      Temp:      TempSrc:      SpO2:      Weight: (!) 170.8 kg (376 lb 8 oz)     Height:        General: alert, cooperative and no distress Lochia: appropriate Uterine Fundus: firm Perineum: intact Incision: Healing well with no significant drainage / wound vac in place Extremities: 3+ edema DVT Evaluation: No evidence of DVT seen on physical  exam.   Discharge instructions:  "Baby and Me Booklet" and [facility] Booklet  Discharge Medications:    Medication List    TAKE these medications   acetaminophen 500 MG tablet Commonly known as:  TYLENOL Take 1,000 mg by mouth every 6 (six) hours as needed for mild pain, moderate pain or headache.   hydrALAZINE 10 MG tablet Commonly known as:  APRESOLINE Take 1 tablet (10 mg total) by mouth every 8 (eight) hours.   hydrochlorothiazide 25 MG tablet Commonly known as:  HYDRODIURIL Take 1 tablet (25 mg total) by mouth daily.   ibuprofen 600 MG tablet Commonly known as:  ADVIL,MOTRIN Take 1 tablet (600 mg total) by mouth every 6 (six) hours.   labetalol 200 MG tablet Commonly known as:  NORMODYNE Take 2 tablets (400 mg total) by mouth 3 (three) times daily. What changed:  how much to take  when to take this   metFORMIN 500 MG 24 hr tablet Commonly known as:  GLUCOPHAGE-XR Take 500 mg by mouth 2 (two) times daily with a meal.   NIFEdipine 30 MG 24 hr tablet Commonly known as:  PROCARDIA-XL/ADALAT CC Take 1 tablet (30 mg total) by mouth daily.   oxyCODONE 5 MG immediate release tablet Commonly known as:  Oxy IR/ROXICODONE Take 1 tablet (5 mg total) by mouth every 4 (four) hours as needed (pain scale 4-7).   prenatal multivitamin Tabs tablet Take 1 tablet by mouth daily.       Diet: carb modified diet  Activity: Advance as tolerated.  Pelvic rest x 6 weeks.   Follow up:6 weeks    Signed: Marlinda MikeBAILEY, TANYA CNM, MSN, [facility] 01/23/2016, 10:32 AM   MD addendum: Pt, a chronic hypertensive who had been well controlled in pregnancy on low dose labetalol, was admitted with severe bp's. BMZ given. Labs for preeclampsia remained in normal range but bp's were very difficult to control. IV protocols begum. Anasthesia and MFM consulted. Bedside u/s done. Pt c/o HA and SOB. CXR done to f/u pulmonary edema. Magnesium was given. Decision for c/s as pt was remote from delivery with uncontrolled htn. Uncomplicated delivery and PP course. Pt resumed triple agents for bp control PP and was d/c with close f/u with her PCP for management on bp.   Alexandre Lightsey A. 02/09/2016 8:03 AM

## 2016-01-23 NOTE — Progress Notes (Signed)
Spoke with Marlinda Mikeanya Bailey, CNM. Provider is aware of blood pressures. Patient given instructions to call nephrology office per Dr. Billy Coastaavon for f/u appointment. Will D/C patient home per orders given.

## 2016-01-25 ENCOUNTER — Encounter (HOSPITAL_COMMUNITY): Payer: Self-pay | Admitting: Obstetrics

## 2016-02-06 ENCOUNTER — Ambulatory Visit: Payer: Self-pay

## 2016-02-06 NOTE — Lactation Note (Signed)
This note was copied from a baby's chart. Lactation Consultation Note  Patient Name: Girl Joyce CopaLakeya Riesgo ZOXWR'UToday's Date: 02/06/2016 Reason for consult: Follow-up assessment;NICU baby;Infant < 6lbs;Late preterm infant   [facility] as she is concerned her milk supply is decreasing. Mom reports she is getting half the volume she was getting in the past few weeks. She reports she has decreased her pumping because she had an abundant supply to 5-6 x a day and sleeping 7-8 hours at night. Enc mom to increase pumpings back to 8 x a day and to add power pumping at night to see if that increases her supply. Gave her information in [place] to discuss with her OB but informed her Fenugreek will not work if pumping not also increased.   Offered to assist mom to put infant to breast. She reports she is usually here at night. Infant was getting a bottle when I was there. Enc mom to let LC know when she will be here during the day so we can schedule a time to assist her get infant to breast, mom voiced understanding. Follow up prn.    Maternal Data    Feeding Feeding Type: Breast Milk Nipple Type: Slow - flow Length of feed: 35 min (15 nippling/20 gavage)  LATCH Score/Interventions                      Lactation Tools Discussed/Used Pump Review: Setup, frequency, and cleaning;Milk Storage   Consult Status Consult Status: PRN Follow-up type: Call as needed    Ed BlalockSharon S Karalee Hauter 02/06/2016, 1:47 PM

## 2016-03-13 ENCOUNTER — Ambulatory Visit: Payer: Self-pay

## 2016-03-13 NOTE — Lactation Note (Signed)
This note was copied from a baby's chart. Lactation Consultation Note  Patient Name: [name] Dunlap CBSWH'Q Date: 03/13/2016 Reason for consult: Follow-up assessment;NICU baby   Mom reported she boiled her pump parts and melted them 4 days ago. New pump kit given. Mom reports her milk supply went down, She is taking Fenugreek and it is up but not as high as it was. Mom reports she is pumping every 3 hours and she is keeping up with infant's needs. Lactation Cookie Recipe and information in [employer]. Mom pleased to receive information. Enc mom to call with further questions/concerns prn.    Maternal Data    Feeding Feeding Type: Breast Milk Nipple Type: Slow - flow Length of feed: 20 min  LATCH Score/Interventions                      Lactation Tools Discussed/Used Pump Review: Setup, frequency, and cleaning   Consult Status Consult Status: PRN Follow-up type: Call as needed    Donn Pierini 03/13/2016, 2:43 PM

## 2016-03-27 ENCOUNTER — Encounter (HOSPITAL_COMMUNITY): Payer: Self-pay | Admitting: Obstetrics

## 2017-10-25 ENCOUNTER — Emergency Department (HOSPITAL_BASED_OUTPATIENT_CLINIC_OR_DEPARTMENT_OTHER)
Admission: EM | Admit: 2017-10-25 | Discharge: 2017-10-25 | Disposition: A | Discharge status: Home / Self Care | Payer: Medicare Other | Attending: Emergency Medicine | Admitting: Emergency Medicine

## 2017-10-25 ENCOUNTER — Other Ambulatory Visit: Payer: Self-pay

## 2017-10-25 ENCOUNTER — Encounter (HOSPITAL_BASED_OUTPATIENT_CLINIC_OR_DEPARTMENT_OTHER): Payer: Self-pay | Admitting: *Deleted

## 2017-10-25 DIAGNOSIS — R350 Frequency of micturition: Secondary | ICD-10-CM

## 2017-10-25 DIAGNOSIS — Z7984 Long term (current) use of oral hypoglycemic drugs: Secondary | ICD-10-CM | POA: Diagnosis not present

## 2017-10-25 DIAGNOSIS — Z79899 Other long term (current) drug therapy: Secondary | ICD-10-CM | POA: Diagnosis not present

## 2017-10-25 DIAGNOSIS — I10 Essential (primary) hypertension: Secondary | ICD-10-CM | POA: Insufficient documentation

## 2017-10-25 LAB — WET PREP, GENITAL
CLUE CELLS WET PREP: NONE SEEN
Sperm: NONE SEEN
TRICH WET PREP: NONE SEEN
YEAST WET PREP: NONE SEEN

## 2017-10-25 LAB — URINALYSIS, ROUTINE W REFLEX MICROSCOPIC
Bilirubin Urine: NEGATIVE
Glucose, UA: NEGATIVE mg/dL
Hgb urine dipstick: NEGATIVE
KETONES UR: NEGATIVE mg/dL
LEUKOCYTES UA: NEGATIVE
NITRITE: NEGATIVE
PROTEIN: NEGATIVE mg/dL
Specific Gravity, Urine: 1.02 (ref 1.005–1.030)
pH: 6.5 (ref 5.0–8.0)

## 2017-10-25 LAB — PREGNANCY, URINE: PREG TEST UR: NEGATIVE

## 2017-10-25 MED ORDER — FLUCONAZOLE 150 MG PO TABS: 150.0000 mg | ORAL_TABLET | Freq: Every day | ORAL | 0 refills | Status: AC

## 2017-10-25 MED FILL — FLUCONAZOLE 150 MG TABS: 150 | 7 days supply | Qty: 7 | Fill #0

## 2017-10-25 NOTE — ED Provider Notes (Signed)
[facility] EMERGENCY DEPARTMENT Provider Note   CSN: [id number] Arrival date & time: 10/25/17  1004     History   Chief Complaint Chief Complaint  Patient presents with  . UTI, STD    HPI Jabrea Kallstrom is a 37 y.o. female.  The history is provided by the patient. No language interpreter was used.    Maudie Shingledecker is a 37 y.o. female who presents to the Emergency Department complaining of urinary frequency. Presents for evaluation of urinary frequency for the last four days. She has frequency and urgency without dysuria. She also endorses light clear vaginal discharge for the last four days. She has some suprapubic discomfort. She denies any fevers, nausea, vomiting, diarrhea. She is sexually active and does not use protection. She has a history of irregular menses in her last menstrual cycle was in April. She has taken a home pregnancy test and it was negative. She has a history of hypertension and does take her blood pressure medications as directed, last dose was this morning.  Past Medical History:  Diagnosis Date  . Complication of anesthesia    With oral surgery and polyp removal required higher dose of anesthetic than anticipated.  . Hypertension   . Postpartum care following cesarean delivery (8/31) 01/20/2016  . Postpartum care following cesarean delivery (9/1) 01/20/2016    Patient Active Problem List   Diagnosis Date Noted  . Severe preeclampsia 01/20/2016  . Postpartum care following cesarean delivery (8/31) 01/20/2016    Past Surgical History:  Procedure Laterality Date  . CESAREAN SECTION N/A 01/19/2016   Procedure: CESAREAN SECTION;  Surgeon: Noland Fordyce, MD;  Location: [facility] BIRTHING SUITES;  Service: Obstetrics;  Laterality: N/A;  . FOOT SURGERY Right    arch  . TONSILLECTOMY       OB History    Gravida  1   Para  1   Term      Preterm  1   AB      Living        SAB      TAB      Ectopic      Multiple  0   Live Births                 Home Medications    Prior to Admission medications   Medication Sig Start Date End Date Taking? Authorizing Provider  hydrALAZINE (APRESOLINE) 10 MG tablet Take 1 tablet (10 mg total) by mouth every 8 (eight) hours. 01/23/16  Yes Marlinda Mike, CNM  labetalol (NORMODYNE) 200 MG tablet Take 2 tablets (400 mg total) by mouth 3 (three) times daily. 01/23/16  Yes Marlinda Mike, CNM  metFORMIN (GLUCOPHAGE-XR) 500 MG 24 hr tablet Take 500 mg by mouth 2 (two) times daily with a meal.   Yes [provider]  acetaminophen (TYLENOL) 500 MG tablet Take 1,000 mg by mouth every 6 (six) hours as needed for mild pain, moderate pain or headache.    [provider]  hydrochlorothiazide (HYDRODIURIL) 25 MG tablet Take 1 tablet (25 mg total) by mouth daily. 01/23/16   Marlinda Mike, CNM  ibuprofen (ADVIL,MOTRIN) 600 MG tablet Take 1 tablet (600 mg total) by mouth every 6 (six) hours. 01/23/16   Marlinda Mike, CNM  Iron Polysacch Cmplx-B12-FA 150-0.025-1 MG CAPS Take 1 tablet by mouth daily. 01/23/16   Marlinda Mike, CNM  Magnesium 400 MG TABS Take 400 mg by mouth daily. 01/23/16   Marlinda Mike, CNM  NIFEdipine (PROCARDIA-XL/ADALAT CC) 30  MG 24 hr tablet Take 1 tablet (30 mg total) by mouth daily. 01/23/16   Marlinda Mike, CNM  oxyCODONE (OXY IR/ROXICODONE) 5 MG immediate release tablet Take 1 tablet (5 mg total) by mouth every 4 (four) hours as needed (pain scale 4-7). 01/23/16   Marlinda Mike, CNM  Prenatal Vit-Fe Fumarate-FA (PRENATAL MULTIVITAMIN) TABS tablet Take 1 tablet by mouth daily.    [provider]    Family History History reviewed. No pertinent family history.  Social History Social History   Tobacco Use  . Smoking status: Never Smoker  . Smokeless tobacco: Never Used  Substance Use Topics  . Alcohol use: Yes    Comment: occasional  . Drug use: No     Allergies   Patient has no known allergies.   Review of Systems Review of Systems  All other systems  reviewed and are negative.    Physical Exam Updated Vital Signs BP (!) 184/105 (BP Location: Left Arm)   Pulse 64   Temp 98.5 F (36.9 C) (Oral)   Resp 18   Ht 5\' 7"  (1.702 m)   Wt (!) 141.5 kg (311 lb 15.2 oz)   SpO2 100%   BMI 48.86 kg/m   Physical Exam  Constitutional: She is oriented to person, place, and time. She appears well-developed and well-nourished.  HENT:  Head: Normocephalic and atraumatic.  Cardiovascular: Normal rate and regular rhythm.  No murmur heard. Pulmonary/Chest: Effort normal and breath sounds normal. No respiratory distress.  Abdominal: Soft. There is no tenderness. There is no rebound and no guarding.  Genitourinary:  Genitourinary Comments: Small amount of thick white discharge. No CMT.  Musculoskeletal: She exhibits no edema or tenderness.  Neurological: She is alert and oriented to person, place, and time.  Skin: Skin is warm and dry.  Psychiatric: She has a normal mood and affect. Her behavior is normal.  Nursing note and vitals reviewed.    ED Treatments / Results  Labs (all labs ordered are listed, but only abnormal results are displayed) Labs Reviewed  WET PREP, GENITAL - Abnormal; Notable for the following components:      Result Value   WBC, Wet Prep HPF POC MANY (*)    All other components within normal limits  PREGNANCY, URINE  URINALYSIS, ROUTINE W REFLEX MICROSCOPIC  RPR  HIV ANTIBODY (ROUTINE TESTING)  GC/CHLAMYDIA PROBE AMP ([place]) NOT AT [facility]    EKG None  Radiology No results found.  Procedures Procedures (including critical care time)  Medications Ordered in ED Medications - No data to display   Initial Impression / Assessment and Plan / ED Course  I have reviewed the triage vital signs and the nursing notes.  Pertinent labs & imaging results that were available during my care of the patient were reviewed by me and considered in my medical decision making (see chart for details).     Patient here  for evaluation of agile discharge and urinary frequency. Pelvic examination with chunky white discharge, appears yeast like but what prep is negative for use. Given clinical exam will treat with one-time dose of Diflucan for possible use. Exam is not consistent with PID, cervicitis, tubo ovarian abscess or torsion. Offered patient pelvic ultrasound in the department and she declined. In terms of urinary frequency there is no evidence of urinary tract infection. Presentation is not consistent with renal colic. Discussed oral fluid hydration. Patient is hypertensive in the emergency department but in no acute distress. No concerning features for hypertensive urgency.  Counseled patient on OB/GYN follow-up, home care, return precautions.  Final Clinical Impressions(s) / ED Diagnoses   Final diagnoses:  Urinary frequency    ED Discharge Orders    None       Tilden Fossaees, Swade Shonka, MD 10/25/17 1227

## 2017-10-25 NOTE — ED Triage Notes (Signed)
C/o frequent urination, abdominal pain, vaginal discharges 4 days ago.

## 2017-10-25 NOTE — ED Notes (Signed)
ED Provider at bedside. 

## 2017-10-26 LAB — HIV ANTIBODY (ROUTINE TESTING W REFLEX): HIV Screen 4th Generation wRfx: NONREACTIVE

## 2017-10-26 LAB — RPR: RPR Ser Ql: NONREACTIVE

## 2017-10-28 LAB — GC/CHLAMYDIA PROBE AMP (~~LOC~~) NOT AT ARMC
Chlamydia: NEGATIVE
NEISSERIA GONORRHEA: NEGATIVE

## 2018-05-03 ENCOUNTER — Other Ambulatory Visit: Payer: Self-pay

## 2018-05-03 ENCOUNTER — Emergency Department (HOSPITAL_BASED_OUTPATIENT_CLINIC_OR_DEPARTMENT_OTHER): Payer: Medicare Other

## 2018-05-03 ENCOUNTER — Encounter (HOSPITAL_BASED_OUTPATIENT_CLINIC_OR_DEPARTMENT_OTHER): Payer: Self-pay | Admitting: Emergency Medicine

## 2018-05-03 ENCOUNTER — Emergency Department (HOSPITAL_BASED_OUTPATIENT_CLINIC_OR_DEPARTMENT_OTHER)
Admission: EM | Admit: 2018-05-03 | Discharge: 2018-05-03 | Disposition: A | Discharge status: Home / Self Care | Payer: Medicare Other | Attending: Emergency Medicine | Admitting: Emergency Medicine

## 2018-05-03 DIAGNOSIS — Y9301 Activity, walking, marching and hiking: Secondary | ICD-10-CM | POA: Diagnosis not present

## 2018-05-03 DIAGNOSIS — Y999 Unspecified external cause status: Secondary | ICD-10-CM | POA: Diagnosis not present

## 2018-05-03 DIAGNOSIS — W1781XA Fall down embankment (hill), initial encounter: Secondary | ICD-10-CM | POA: Insufficient documentation

## 2018-05-03 DIAGNOSIS — S62316A Displaced fracture of base of fifth metacarpal bone, right hand, initial encounter for closed fracture: Secondary | ICD-10-CM

## 2018-05-03 DIAGNOSIS — Z7984 Long term (current) use of oral hypoglycemic drugs: Secondary | ICD-10-CM | POA: Insufficient documentation

## 2018-05-03 DIAGNOSIS — M545 Low back pain, unspecified: Secondary | ICD-10-CM

## 2018-05-03 DIAGNOSIS — E119 Type 2 diabetes mellitus without complications: Secondary | ICD-10-CM | POA: Diagnosis not present

## 2018-05-03 DIAGNOSIS — Y929 Unspecified place or not applicable: Secondary | ICD-10-CM | POA: Insufficient documentation

## 2018-05-03 DIAGNOSIS — Z79899 Other long term (current) drug therapy: Secondary | ICD-10-CM | POA: Diagnosis not present

## 2018-05-03 DIAGNOSIS — S6991XA Unspecified injury of right wrist, hand and finger(s), initial encounter: Secondary | ICD-10-CM | POA: Diagnosis present

## 2018-05-03 LAB — PREGNANCY, URINE: Preg Test, Ur: NEGATIVE

## 2018-05-03 MED ORDER — OXYCODONE-ACETAMINOPHEN 5-325 MG PO TABS: 1.0000 | ORAL_TABLET | ORAL | 0 refills | Status: DC | PRN

## 2018-05-03 MED ORDER — METHOCARBAMOL 500 MG PO TABS: 500.0000 mg | ORAL_TABLET | Freq: Two times a day (BID) | ORAL | 0 refills | Status: AC

## 2018-05-03 MED ORDER — OXYCODONE-ACETAMINOPHEN 5-325 MG PO TABS
1.0000 | ORAL_TABLET | Freq: Once | ORAL | Status: DC
  Filled 2018-05-03: qty 1

## 2018-05-03 MED ORDER — KETOROLAC TROMETHAMINE 30 MG/ML IJ SOLN
30.0000 mg | Freq: Once | INTRAMUSCULAR | Status: AC
  Administered 2018-05-03: 30 mg via INTRAMUSCULAR
  Filled 2018-05-03: qty 1

## 2018-05-03 NOTE — ED Notes (Signed)
Patient transported to X-ray 

## 2018-05-03 NOTE — ED Triage Notes (Signed)
Pt slipped and fell 4 days ago. C/o R hand and low back pain.

## 2018-05-03 NOTE — Discharge Instructions (Addendum)
Please read instructions below. Keep the splint on and dry at all times.  Keep it clean.  Follow-up with the hand specialist within 1 week. You can take oxycodone every 4-6 hours as needed for severe pain.  You can take Robaxin every 12 hours for muscle spasm.  Be aware these medications can make you drowsy.  Do not take with Tylenol.  Do not drive or drink alcohol while taking this medication. Apply ice to your back for 20 minutes at a time. Return to the ER for new or concerning symptoms.

## 2018-05-03 NOTE — ED Provider Notes (Addendum)
[facility] EMERGENCY DEPARTMENT Provider Note   CSN: [id number] Arrival date & time: 05/03/18  1758     History   Chief Complaint Chief Complaint  Patient presents with  . Fall    HPI [name] Dunlap is a 37 y.o. female presenting after mechanical fall on Monday with right hand and low back pain.  Patient states she was walking down the hill and slipped on some wet leaves.  She states she fell backwards onto her bottom and caught herself on the side of her right hand.  She has had pain to the medial aspect of the right hand since that time.  She also reports severe pain to her low back that is worse with movement and palpation.  She took some of her friend's hydrocodone and Robaxin without much relief.  No numbness or weakness of legs, no bowel or bladder incontinence, no saddle paresthesia.  She is right-hand dominant. No head trauma.   The history is provided by the patient.    Past Medical History:  Diagnosis Date  . Complication of anesthesia    With oral surgery and polyp removal required higher dose of anesthetic than anticipated.  . Hypertension   . Postpartum care following cesarean delivery (8/31) 01/20/2016  . Postpartum care following cesarean delivery (9/1) 01/20/2016    Patient Active Problem List   Diagnosis Date Noted  . Severe preeclampsia 01/20/2016  . Postpartum care following cesarean delivery (8/31) 01/20/2016    Past Surgical History:  Procedure Laterality Date  . CESAREAN SECTION N/A 01/19/2016   Procedure: CESAREAN SECTION;  Surgeon: Noland Fordyce, MD;  Location: [facility] BIRTHING SUIT[facility];  Service: Obstetrics;  Laterality: N/A;  . FOOT SURGERY Right    arch  . TONSILLECTOMY       OB History    Gravida  1   Para  1   Term      Preterm  1   AB      Living        SAB      TAB      Ectopic      Multiple  0   Live Births               Home Medications    Prior to Admission medications   Medication Sig Start Date End Date  Taking? Authorizing Provider  acetaminophen (TYLENOL) 500 MG tablet Take 1,000 mg by mouth every 6 (six) hours as needed for mild pain, moderate pain or headache.    [provider]  hydrALAZINE (APRESOLINE) 10 MG tablet Take 1 tablet (10 mg total) by mouth every 8 (eight) hours. 01/23/16   Marlinda Mike, CNM  hydrochlorothiazide (HYDRODIURIL) 25 MG tablet Take 1 tablet (25 mg total) by mouth daily. 01/23/16   Marlinda Mike, CNM  ibuprofen (ADVIL,MOTRIN) 600 MG tablet Take 1 tablet (600 mg total) by mouth every 6 (six) hours. 01/23/16   Marlinda Mike, CNM  Iron Polysacch Cmplx-B12-FA 150-0.025-1 MG CAPS Take 1 tablet by mouth daily. 01/23/16   Marlinda Mike, CNM  labetalol (NORMODYNE) 200 MG tablet Take 2 tablets (400 mg total) by mouth 3 (three) times daily. 01/23/16   Marlinda Mike, CNM  Magnesium 400 MG TABS Take 400 mg by mouth daily. 01/23/16   Marlinda Mike, CNM  metFORMIN (GLUCOPHAGE-XR) 500 MG 24 hr tablet Take 500 mg by mouth 2 (two) times daily with a meal.    [provider]  methocarbamol (ROBAXIN) 500 MG tablet Take 1 tablet (500  mg total) by mouth 2 (two) times daily. 05/03/18   Gregrey Bloyd, [place] N, PA-C  NIFEdipine (PROCARDIA-XL/ADALAT CC) 30 MG 24 hr tablet Take 1 tablet (30 mg total) by mouth daily. 01/23/16   Marlinda MikeBailey, Tanya, CNM  oxyCODONE (OXY IR/ROXICODONE) 5 MG immediate release tablet Take 1 tablet (5 mg total) by mouth every 4 (four) hours as needed (pain scale 4-7). 01/23/16   Marlinda MikeBailey, Tanya, CNM  oxyCODONE-acetaminophen (PERCOCET/ROXICET) 5-325 MG tablet Take 1-2 tablets by mouth every 4 (four) hours as needed for severe pain. 05/03/18   Chaquetta Schlottman, [place] N, PA-C  Prenatal Vit-Fe Fumarate-FA (PRENATAL MULTIVITAMIN) TABS tablet Take 1 tablet by mouth daily.    [provider]    Family History No family history on file.  Social History Social History   Tobacco Use  . Smoking status: Never Smoker  . Smokeless tobacco: Never Used  Substance Use Topics  . Alcohol  use: Yes    Comment: occasional  . Drug use: No     Allergies   Patient has no known allergies.   Review of Systems Review of Systems  Musculoskeletal: Positive for arthralgias and back pain.  Neurological: Negative for weakness and numbness.  All other systems reviewed and are negative.    Physical Exam Updated Vital Signs BP (!) 183/104 (BP Location: Left Arm)   Pulse 61   Temp 98.3 F (36.8 C) (Oral)   Resp 18   Ht 5' 7.5" (1.715 m)   Wt (!) 144.7 kg   SpO2 100%   BMI 49.23 kg/m   Physical Exam Vitals signs and nursing note reviewed.  Constitutional:      General: She is not in acute distress.    Appearance: She is well-developed. She is obese.  HENT:     Head: Normocephalic and atraumatic.  Eyes:     Conjunctiva/sclera: Conjunctivae normal.  Neck:     Musculoskeletal: Normal range of motion.  Cardiovascular:     Rate and Rhythm: Normal rate.  Pulmonary:     Effort: Pulmonary effort is normal.  Musculoskeletal:     Comments: General tenderness to midline L-spine. TTP  to bilateral paraspinal musculature as well.   Medial aspect of the dorsum of the right hand with mild swelling.  No ecchymosis.  No obvious deformity.  There is tenderness in the region of the base of the fourth metacarpal.  Normal range of motion of hand and wrist.  No Wounds.  Normal Sensation to digits.  Neurological:     Mental Status: She is alert.     Comments: Motor:  Normal tone. 5/5 in lower extremities bilaterally including strong and equal dorsiflexion/plantar flexion Sensory: Pinprick and light touch normal in BLE extremities.  Deep Tendon Reflexes: 2+ and symmetric in the b/l patella Gait: normal gait and balance CV: distal pulses palpable throughout  a  Psychiatric:        Mood and Affect: Mood normal.        Behavior: Behavior normal.      ED Treatments / Results  Labs (all labs ordered are listed, but only abnormal results are displayed) Labs Reviewed  PREGNANCY,  URINE    EKG None  Radiology Dg Lumbar Spine Complete  Result Date: 05/03/2018 CLINICAL DATA:  Low back pain since a slip and fall on wet leave several days ago. Initial encounter. EXAM: LUMBAR SPINE - COMPLETE 4+ VIEW COMPARISON:  None. FINDINGS: There is no evidence of lumbar spine fracture. Alignment is normal. Intervertebral disc spaces are maintained. Small  calcified uterine fibroid noted. IMPRESSION: Normal lumbar spine. Electronically Signed   By: Drusilla Kanner M.D.   On: 05/03/2018 21:37   Dg Hand Complete Right  Result Date: 05/03/2018 CLINICAL DATA:  Fall 4 days ago.  Right hand/5th digit pain. EXAM: RIGHT HAND - COMPLETE 3+ VIEW COMPARISON:  None. FINDINGS: Fracture noted through the base of the right 5th metacarpal, minimal displacement. No subluxation or dislocation. No additional acute bony abnormality. IMPRESSION: Minimally displaced fracture through the base of the right 5th metacarpal. Electronically Signed   By: Charlett Nose M.D.   On: 05/03/2018 18:50    Procedures Procedures (including critical care time)  Medications Ordered in ED Medications  oxyCODONE-acetaminophen (PERCOCET/ROXICET) 5-325 MG per tablet 1 tablet (has no administration in time range)  ketorolac (TORADOL) 30 MG/ML injection 30 mg (30 mg Intramuscular Given 05/03/18 2200)     Initial Impression / Assessment and Plan / ED Course  I have reviewed the triage vital signs and the nursing notes.  Pertinent labs & imaging results that were available during my care of the patient were reviewed by me and considered in my medical decision making (see chart for details).    Patient with right hand pain and low back pain after mechanical fall on Monday.  Back pain without red flags.  Normal neuro exam.  X-ray of right hand reveals a minimally displaced fracture to the base of the right fifth metacarpal.  X-ray of the L-spine is negative.  Hand placed in ulnar gutter splint.  Sling for support.   Recommended NSAIDs, RICE therapy.  Pain medication prescribed.  Recommend close PCP follow-up.  Hand specialist referral provided.  Agreeable to plan and safe for discharge.  [place] Controlled Substance reporting System queried  Discussed results, findings, treatment and follow up. Patient advised of return precautions. Patient verbalized understanding and agreed with plan.   Final Clinical Impressions(s) / ED Diagnoses   Final diagnoses:  Closed displaced fracture of base of fifth metacarpal bone of right hand, initial encounter  Acute bilateral low back pain without sciatica    ED Discharge Orders         Ordered    oxyCODONE-acetaminophen (PERCOCET/ROXICET) 5-325 MG tablet  Every 4 hours PRN     05/03/18 2229    methocarbamol (ROBAXIN) 500 MG tablet  2 times daily     05/03/18 2229           Derica Leiber, [place] N, PA-C 05/03/18 2246    Nyjah Schwake, [place] N, PA-C 05/03/18 2248    Loren Racer, MD 05/04/18 [phone]

## 2019-09-04 IMAGING — CR DG LUMBAR SPINE COMPLETE 4+V
5 series · 5 of 5 positions shown · non-contrast
Comparison: None.

CLINICAL DATA: Low back pain since a slip and fall on wet leave
several days ago. Initial encounter.

EXAM:
LUMBAR SPINE - COMPLETE 4+ VIEW

[t l-spine a.p. *]
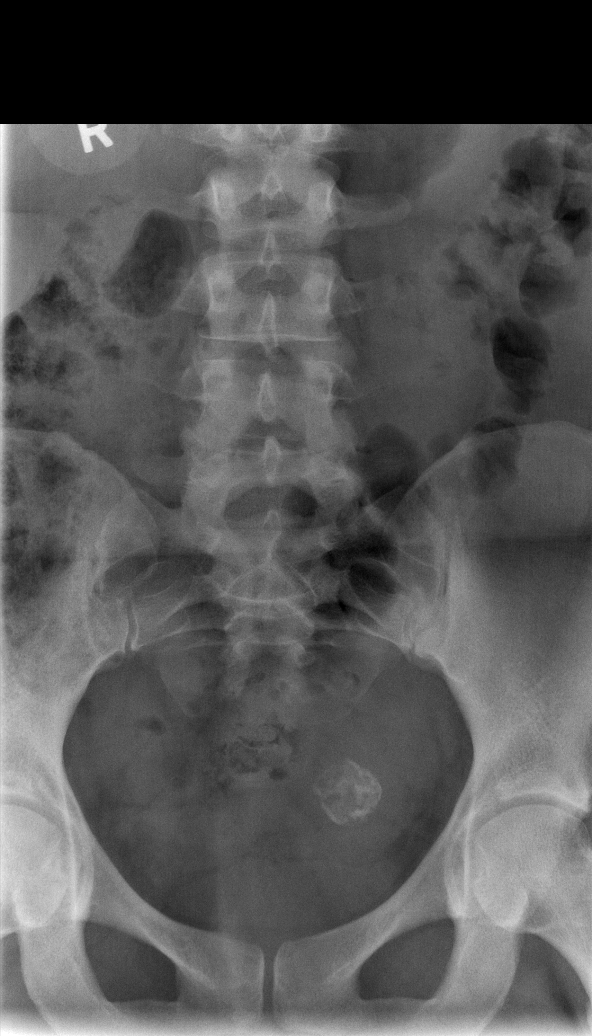

[t l-spine oblique exposure * (1 of 2)]
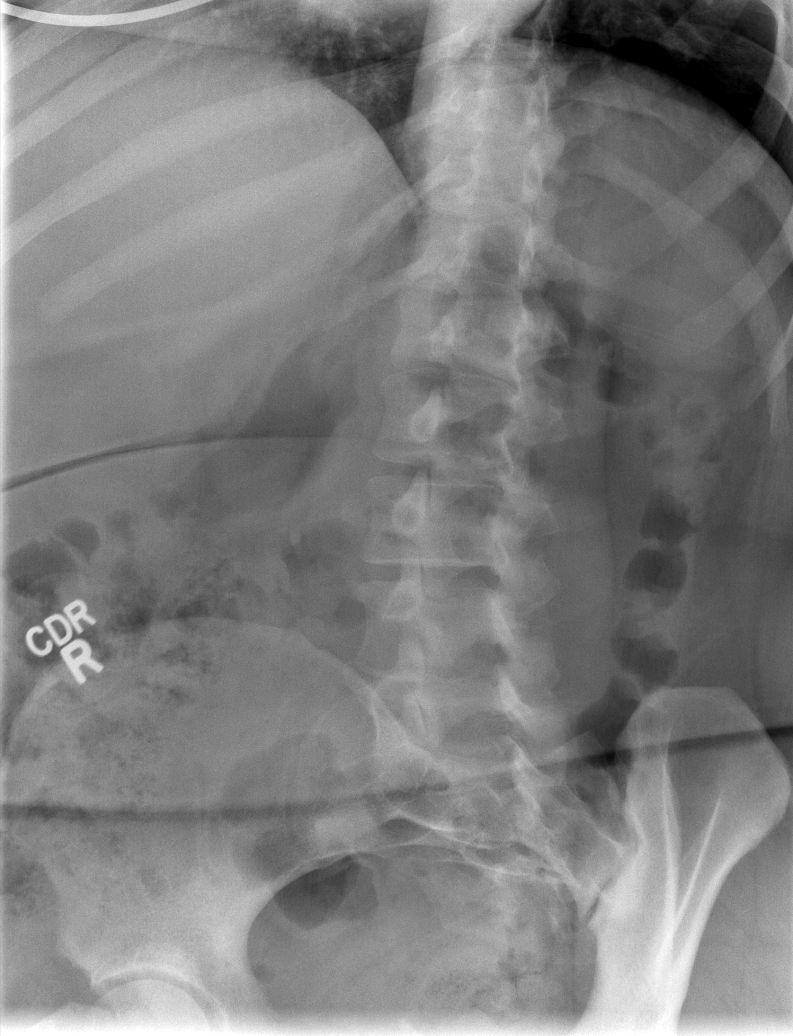

[t l-spine oblique exposure * (2 of 2)]
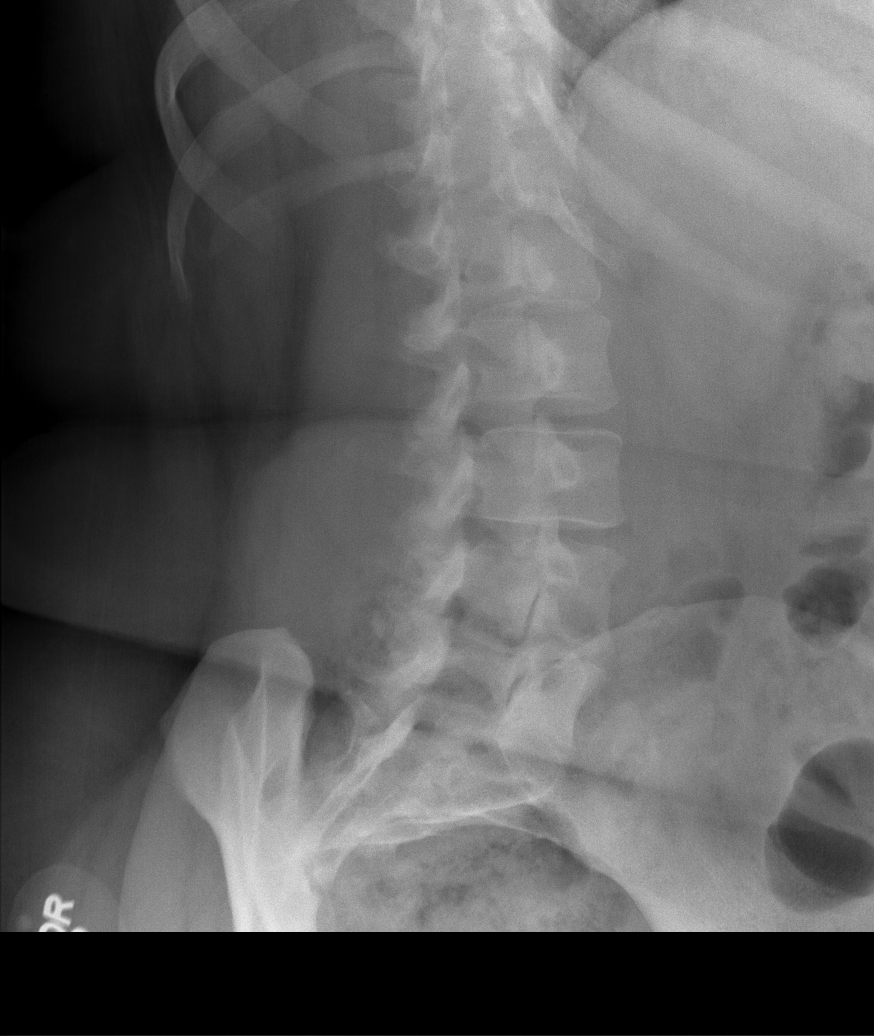

[t l-spine lat *]
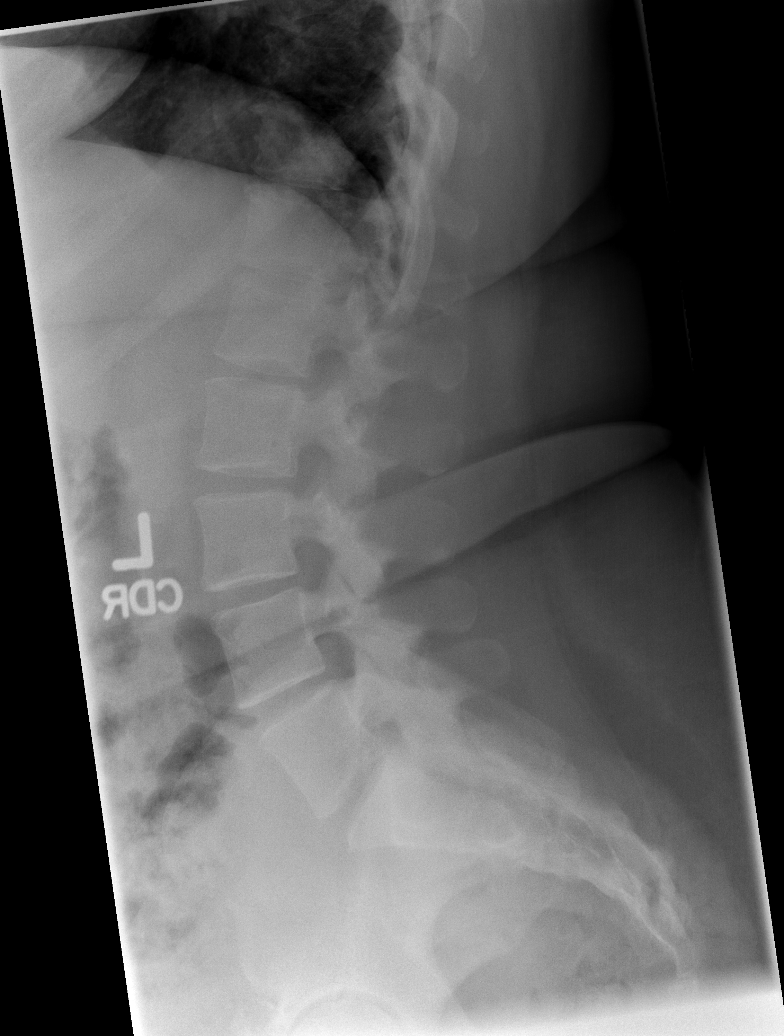

[t l-spine l5-s1 spot *]
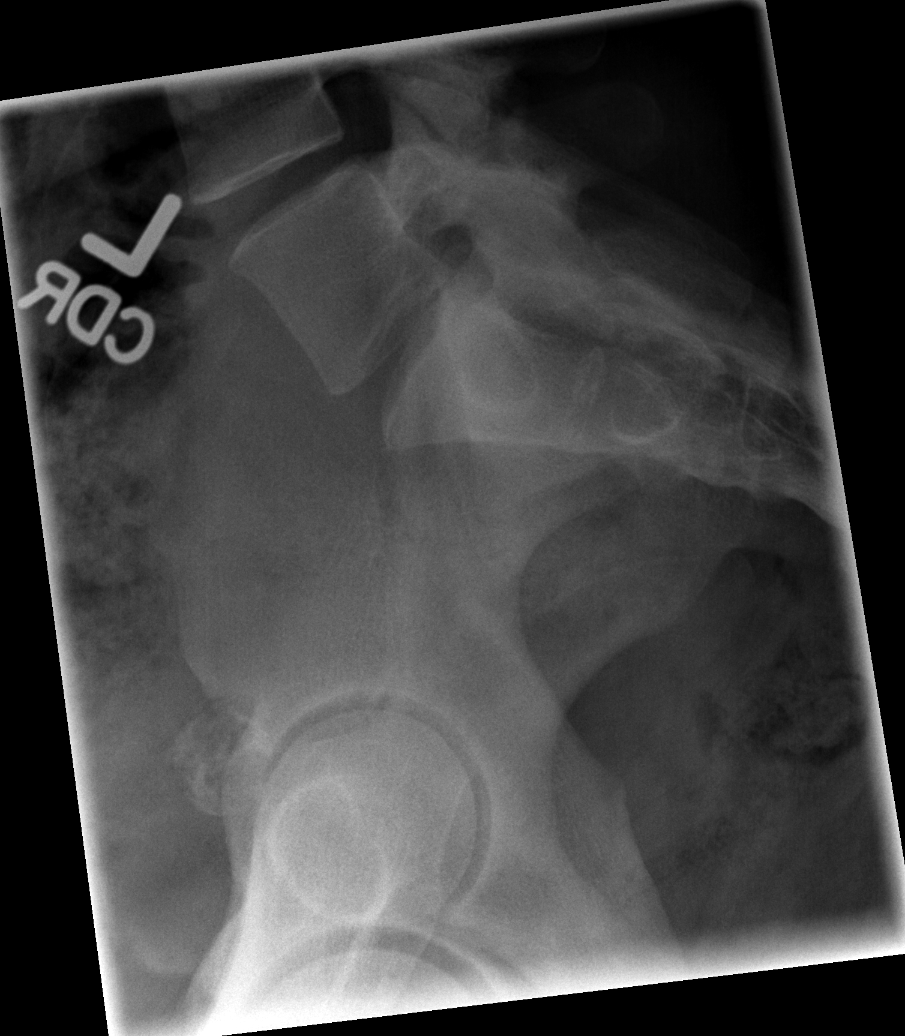

[5 of 5 positions shown; findings below may reference images not displayed]

FINDINGS: There is no evidence of lumbar spine fracture. Alignment is normal.
Intervertebral disc spaces are maintained. Small calcified uterine
fibroid noted.
IMPRESSION: Normal lumbar spine.

## 2020-12-21 ENCOUNTER — Encounter (HOSPITAL_BASED_OUTPATIENT_CLINIC_OR_DEPARTMENT_OTHER): Payer: Self-pay

## 2020-12-21 ENCOUNTER — Other Ambulatory Visit: Payer: Self-pay

## 2020-12-21 ENCOUNTER — Emergency Department (HOSPITAL_BASED_OUTPATIENT_CLINIC_OR_DEPARTMENT_OTHER)
Admission: EM | Admit: 2020-12-21 | Discharge: 2020-12-22 | Disposition: A | Discharge status: Home / Self Care | Payer: Medicare Other | Attending: Emergency Medicine | Admitting: Emergency Medicine

## 2020-12-21 DIAGNOSIS — Z87891 Personal history of nicotine dependence: Secondary | ICD-10-CM | POA: Insufficient documentation

## 2020-12-21 DIAGNOSIS — Z79899 Other long term (current) drug therapy: Secondary | ICD-10-CM | POA: Insufficient documentation

## 2020-12-21 DIAGNOSIS — R1032 Left lower quadrant pain: Secondary | ICD-10-CM | POA: Insufficient documentation

## 2020-12-21 DIAGNOSIS — I10 Essential (primary) hypertension: Secondary | ICD-10-CM | POA: Insufficient documentation

## 2020-12-21 DIAGNOSIS — Z7984 Long term (current) use of oral hypoglycemic drugs: Secondary | ICD-10-CM | POA: Insufficient documentation

## 2020-12-21 DIAGNOSIS — N898 Other specified noninflammatory disorders of vagina: Secondary | ICD-10-CM | POA: Diagnosis not present

## 2020-12-21 NOTE — ED Triage Notes (Signed)
Pt c/o vaginal d/c x 3 days-NAD-steady gait 

## 2020-12-22 ENCOUNTER — Other Ambulatory Visit (HOSPITAL_BASED_OUTPATIENT_CLINIC_OR_DEPARTMENT_OTHER): Payer: Medicare Other

## 2020-12-22 ENCOUNTER — Other Ambulatory Visit (HOSPITAL_BASED_OUTPATIENT_CLINIC_OR_DEPARTMENT_OTHER): Payer: Self-pay

## 2020-12-22 ENCOUNTER — Telehealth (HOSPITAL_BASED_OUTPATIENT_CLINIC_OR_DEPARTMENT_OTHER): Payer: Self-pay | Admitting: Emergency Medicine

## 2020-12-22 ENCOUNTER — Ambulatory Visit (HOSPITAL_BASED_OUTPATIENT_CLINIC_OR_DEPARTMENT_OTHER): Admission: RE | Admit: 2020-12-22 | Payer: Medicare Other | Source: Ambulatory Visit

## 2020-12-22 ENCOUNTER — Emergency Department (HOSPITAL_BASED_OUTPATIENT_CLINIC_OR_DEPARTMENT_OTHER): Payer: Medicare Other

## 2020-12-22 ENCOUNTER — Other Ambulatory Visit (HOSPITAL_BASED_OUTPATIENT_CLINIC_OR_DEPARTMENT_OTHER): Payer: Self-pay | Admitting: Emergency Medicine

## 2020-12-22 ENCOUNTER — Emergency Department (HOSPITAL_BASED_OUTPATIENT_CLINIC_OR_DEPARTMENT_OTHER)
Admission: RE | Admit: 2020-12-22 | Discharge: 2020-12-22 | Disposition: A | Discharge status: Home / Self Care | Payer: Medicare Other | Source: Ambulatory Visit | Attending: Emergency Medicine | Admitting: Emergency Medicine

## 2020-12-22 DIAGNOSIS — N898 Other specified noninflammatory disorders of vagina: Secondary | ICD-10-CM | POA: Diagnosis not present

## 2020-12-22 DIAGNOSIS — R52 Pain, unspecified: Secondary | ICD-10-CM

## 2020-12-22 DIAGNOSIS — R102 Pelvic and perineal pain: Secondary | ICD-10-CM | POA: Insufficient documentation

## 2020-12-22 LAB — URINALYSIS, ROUTINE W REFLEX MICROSCOPIC
Bilirubin Urine: NEGATIVE
Glucose, UA: NEGATIVE mg/dL
Hgb urine dipstick: NEGATIVE
Ketones, ur: NEGATIVE mg/dL
Leukocytes,Ua: NEGATIVE
Nitrite: NEGATIVE
Protein, ur: NEGATIVE mg/dL
Specific Gravity, Urine: 1.025 (ref 1.005–1.030)
pH: 6 (ref 5.0–8.0)

## 2020-12-22 LAB — WET PREP, GENITAL
Clue Cells Wet Prep HPF POC: NONE SEEN
Sperm: NONE SEEN
Trich, Wet Prep: NONE SEEN
Yeast Wet Prep HPF POC: NONE SEEN

## 2020-12-22 LAB — HIV ANTIBODY (ROUTINE TESTING W REFLEX): HIV Screen 4th Generation wRfx: NONREACTIVE

## 2020-12-22 LAB — PREGNANCY, URINE: Preg Test, Ur: NEGATIVE

## 2020-12-22 NOTE — Discharge Instructions (Addendum)
The cause of your symptoms was not identified today.  You can take ibuprofen, available over the counter according to label instructions as needed for pain.    Get rechecked if you develop fevers or uncontrolled pain.    Return tomorrow to get your pelvic ultrasound to further evaluate your pelvic pain.

## 2020-12-22 NOTE — ED Provider Notes (Signed)
Blood pressure (!) 160/90, pulse 68, temperature 98.3 F (36.8 C), resp. rate 16, height 5\' 8"  (1.727 m), weight (!) 163.3 kg, SpO2 98 %, unknown if currently breastfeeding.  In short, [name] Dunlap is a 40 y.o. female with a chief complaint of Vaginal Discharge .  Refer to the original H&P for additional details.  Patient return to the emergency department for pelvic ultrasound.  This did not show any ovarian cyst.  No torsion's.  Normal pelvic ultrasound.  She plans to take Tylenol/Motrin as needed for pain and follow with her OB/GYN as an outpatient.     30, MD 12/22/20 [phone]

## 2020-12-22 NOTE — Telephone Encounter (Signed)
Ultrasound complete ordered by Dr. Pecola Leisure not visible to ultrasound for patient return visit.  Reordered for Dr. Haskel Schroeder plan of follow-up

## 2020-12-22 NOTE — ED Notes (Signed)
ED Provider at bedside. 

## 2020-12-22 NOTE — ED Provider Notes (Signed)
[facility] EMERGENCY DEPARTMENT Provider Note   CSN: [id number] Arrival date & time: 12/21/20  2217     History Chief Complaint  Patient presents with   Vaginal Discharge    [name] Dunlap is a 40 y.o. female.  The history is provided by the patient.  Vaginal Discharge [name] Dunlap is a 40 y.o. female who presents to the Emergency Department complaining of vaginally discharge. She presents the emergency department for evaluation of vaginal discharge that has been present for the last few days. She has associated left lower quadrant discomfort as well as foul-smelling discharge. She is sexually active but no new sexual partners. No associated dysuria, fevers, vomiting. She had prior similar episodes with STI. She does have an IUD in place.    Past Medical History:  Diagnosis Date   Complication of anesthesia    With oral surgery and polyp removal required higher dose of anesthetic than anticipated.   Hypertension    Postpartum care following cesarean delivery (8/31) 01/20/2016   Postpartum care following cesarean delivery (9/1) 01/20/2016    Patient Active Problem List   Diagnosis Date Noted   Severe preeclampsia 01/20/2016   Postpartum care following cesarean delivery (8/31) 01/20/2016    Past Surgical History:  Procedure Laterality Date   CESAREAN SECTION N/A 01/19/2016   Procedure: CESAREAN SECTION;  Surgeon: Noland Fordyce, MD;  Location: [facility] BIRTHING SUITES;  Service: Obstetrics;  Laterality: N/A;   FOOT SURGERY Right    arch   TONSILLECTOMY       OB History     Gravida  1   Para  1   Term      Preterm  1   AB      Living         SAB      IAB      Ectopic      Multiple  0   Live Births              No family history on file.  Social History   Tobacco Use   Smoking status: Former    Types: Cigarettes   Smokeless tobacco: Never  Vaping Use   Vaping Use: Never used  Substance Use Topics   Alcohol use: Yes    Comment:  occasional   Drug use: No    Home Medications Prior to Admission medications   Medication Sig Start Date End Date Taking? Authorizing Provider  acetaminophen (TYLENOL) 500 MG tablet Take 1,000 mg by mouth every 6 (six) hours as needed for mild pain, moderate pain or headache.    [provider]  hydrALAZINE (APRESOLINE) 10 MG tablet Take 1 tablet (10 mg total) by mouth every 8 (eight) hours. 01/23/16   Marlinda Mike, CNM  hydrochlorothiazide (HYDRODIURIL) 25 MG tablet Take 1 tablet (25 mg total) by mouth daily. 01/23/16   Marlinda Mike, CNM  ibuprofen (ADVIL,MOTRIN) 600 MG tablet Take 1 tablet (600 mg total) by mouth every 6 (six) hours. 01/23/16   Marlinda Mike, CNM  Iron Polysacch Cmplx-B12-FA 150-0.025-1 MG CAPS Take 1 tablet by mouth daily. 01/23/16   Marlinda Mike, CNM  labetalol (NORMODYNE) 200 MG tablet Take 2 tablets (400 mg total) by mouth 3 (three) times daily. 01/23/16   Marlinda Mike, CNM  Magnesium 400 MG TABS Take 400 mg by mouth daily. 01/23/16   Marlinda Mike, CNM  metFORMIN (GLUCOPHAGE-XR) 500 MG 24 hr tablet Take 500 mg by mouth 2 (two) times daily with a meal.  [provider]  methocarbamol (ROBAXIN) 500 MG tablet Take 1 tablet (500 mg total) by mouth 2 (two) times daily. 05/03/18   Robinson, [place] N, PA-C  NIFEdipine (PROCARDIA-XL/ADALAT CC) 30 MG 24 hr tablet Take 1 tablet (30 mg total) by mouth daily. 01/23/16   Marlinda Mike, CNM  oxyCODONE (OXY IR/ROXICODONE) 5 MG immediate release tablet Take 1 tablet (5 mg total) by mouth every 4 (four) hours as needed (pain scale 4-7). 01/23/16   Marlinda Mike, CNM  oxyCODONE-acetaminophen (PERCOCET/ROXICET) 5-325 MG tablet Take 1-2 tablets by mouth every 4 (four) hours as needed for severe pain. 05/03/18   Robinson, [place] N, PA-C  Prenatal Vit-Fe Fumarate-FA (PRENATAL MULTIVITAMIN) TABS tablet Take 1 tablet by mouth daily.    [provider]    Allergies    Patient has no known allergies.  Review of Systems    Review of Systems  Genitourinary:  Positive for vaginal discharge.  All other systems reviewed and are negative.  Physical Exam Updated Vital Signs BP (!) 157/124 (BP Location: Left Arm)   Pulse 83   Temp 98.6 F (37 C) (Oral)   Resp 16   Ht 5\' 8"  (1.727 m)   Wt (!) 163.3 kg   SpO2 100%   BMI 54.74 kg/m   Physical Exam Vitals and nursing note reviewed.  Constitutional:      Appearance: She is well-developed.  HENT:     Head: Normocephalic and atraumatic.  Cardiovascular:     Rate and Rhythm: Normal rate and regular rhythm.  Pulmonary:     Effort: Pulmonary effort is normal. No respiratory distress.  Abdominal:     Palpations: Abdomen is soft.     Tenderness: There is no abdominal tenderness. There is no guarding or rebound.  Genitourinary:    Comments: Small amount of curd like vaginally discharge. No CMT. Unable to visualize cervical os Musculoskeletal:        General: No tenderness.  Skin:    General: Skin is warm and dry.  Neurological:     Mental Status: She is alert and oriented to person, place, and time.  Psychiatric:        Behavior: Behavior normal.    ED Results / Procedures / Treatments   Labs (all labs ordered are listed, but only abnormal results are displayed) Labs Reviewed  WET PREP, GENITAL - Abnormal; Notable for the following components:      Result Value   WBC, Wet Prep HPF POC MANY (*)    All other components within normal limits  URINALYSIS, ROUTINE W REFLEX MICROSCOPIC - Abnormal; Notable for the following components:   APPearance CLOUDY (*)    All other components within normal limits  PREGNANCY, URINE  HIV ANTIBODY (ROUTINE TESTING W REFLEX)  RPR  GC/CHLAMYDIA PROBE AMP ([place]) NOT AT [facility]    EKG None  Radiology No results found.  Procedures Procedures   Medications Ordered in ED Medications - No data to display  ED Course  I have reviewed the triage vital signs and the nursing notes.  Pertinent labs & imaging  results that were available during my care of the patient were reviewed by me and considered in my medical decision making (see chart for details).    MDM Rules/Calculators/A&P                          patient here for evaluation of vaginally discharge and left lower quadrant pain. She has scant  discharge on examination with no significant abdominal tenderness. Presentation is not consistent with PID, tubo-ovarian abscess. UA not consistent with UTI. What prep without yeast or BV. Discussed with patient home care for vaginal discharge. Plan to have a return in the morning for pelvic ultrasound to rule out cyst. Presentation is not consistent with torsion. Discussed with patient home care, return precautions.  Final Clinical Impression(s) / ED Diagnoses Final diagnoses:  Vaginal discharge  LLQ pain    Rx / DC Orders ED Discharge Orders     None        Tilden Fossa, MD 12/22/20 [phone]

## 2020-12-23 LAB — GC/CHLAMYDIA PROBE AMP (~~LOC~~) NOT AT ARMC
Chlamydia: NEGATIVE
Comment: NEGATIVE
Comment: NORMAL
Neisseria Gonorrhea: NEGATIVE

## 2020-12-23 LAB — RPR: RPR Ser Ql: NONREACTIVE

## 2021-10-05 ENCOUNTER — Emergency Department (HOSPITAL_BASED_OUTPATIENT_CLINIC_OR_DEPARTMENT_OTHER)
Admission: EM | Admit: 2021-10-05 | Discharge: 2021-10-06 | Disposition: A | Discharge status: Home / Self Care | Payer: Medicare Other | Attending: Emergency Medicine | Admitting: Emergency Medicine

## 2021-10-05 ENCOUNTER — Other Ambulatory Visit: Payer: Self-pay

## 2021-10-05 ENCOUNTER — Encounter (HOSPITAL_BASED_OUTPATIENT_CLINIC_OR_DEPARTMENT_OTHER): Payer: Self-pay

## 2021-10-05 DIAGNOSIS — R072 Precordial pain: Secondary | ICD-10-CM | POA: Diagnosis not present

## 2021-10-05 DIAGNOSIS — R079 Chest pain, unspecified: Secondary | ICD-10-CM | POA: Diagnosis present

## 2021-10-05 DIAGNOSIS — E669 Obesity, unspecified: Secondary | ICD-10-CM | POA: Insufficient documentation

## 2021-10-05 DIAGNOSIS — Z79899 Other long term (current) drug therapy: Secondary | ICD-10-CM | POA: Diagnosis not present

## 2021-10-05 DIAGNOSIS — R600 Localized edema: Secondary | ICD-10-CM | POA: Diagnosis not present

## 2021-10-05 DIAGNOSIS — I10 Essential (primary) hypertension: Secondary | ICD-10-CM | POA: Diagnosis not present

## 2021-10-05 DIAGNOSIS — R7303 Prediabetes: Secondary | ICD-10-CM | POA: Diagnosis not present

## 2021-10-05 LAB — CBC WITH DIFFERENTIAL/PLATELET
Abs Immature Granulocytes: 0.01 10*3/uL (ref 0.00–0.07)
Basophils Absolute: 0 10*3/uL (ref 0.0–0.1)
Basophils Relative: 1 %
Eosinophils Absolute: 0.2 10*3/uL (ref 0.0–0.5)
Eosinophils Relative: 2 %
HCT: 38.6 % (ref 36.0–46.0)
Hemoglobin: 12.8 g/dL (ref 12.0–15.0)
Immature Granulocytes: 0 %
Lymphocytes Relative: 53 %
Lymphs Abs: 4.4 10*3/uL — ABNORMAL HIGH (ref 0.7–4.0)
MCH: 29.6 pg (ref 26.0–34.0)
MCHC: 33.2 g/dL (ref 30.0–36.0)
MCV: 89.1 fL (ref 80.0–100.0)
Monocytes Absolute: 0.7 10*3/uL (ref 0.1–1.0)
Monocytes Relative: 9 %
Neutro Abs: 2.9 10*3/uL (ref 1.7–7.7)
Neutrophils Relative %: 35 %
Platelets: 302 10*3/uL (ref 150–400)
RBC: 4.33 MIL/uL (ref 3.87–5.11)
RDW: 13.6 % (ref 11.5–15.5)
WBC: 8.2 10*3/uL (ref 4.0–10.5)
nRBC: 0 % (ref 0.0–0.2)

## 2021-10-05 LAB — COMPREHENSIVE METABOLIC PANEL
ALT: 19 U/L (ref 0–44)
AST: 19 U/L (ref 15–41)
Albumin: 3.8 g/dL (ref 3.5–5.0)
Alkaline Phosphatase: 60 U/L (ref 38–126)
Anion gap: 6 (ref 5–15)
BUN: 12 mg/dL (ref 6–20)
CO2: 24 mmol/L (ref 22–32)
Calcium: 8.8 mg/dL — ABNORMAL LOW (ref 8.9–10.3)
Chloride: 107 mmol/L (ref 98–111)
Creatinine, Ser: 0.6 mg/dL (ref 0.44–1.00)
GFR, Estimated: >60 mL/min (ref >60)
Glucose, Bld: 87 mg/dL (ref 70–99)
Potassium: 3.4 mmol/L — ABNORMAL LOW (ref 3.5–5.1)
Sodium: 137 mmol/L (ref 135–145)
Total Bilirubin: 0.6 mg/dL (ref 0.3–1.2)
Total Protein: 7.6 g/dL (ref 6.5–8.1)

## 2021-10-05 MED ORDER — NITROGLYCERIN 0.4 MG SL SUBL
0.4000 mg | SUBLINGUAL_TABLET | SUBLINGUAL | Status: DC | PRN
  Administered 2021-10-06: 0.4 mg via SUBLINGUAL
  Filled 2021-10-05: qty 1

## 2021-10-05 NOTE — ED Provider Notes (Signed)
[facility] EMERGENCY DEPARTMENT Provider Note   CSN: [id number] Arrival date & time: 10/05/21  2046     History  Chief Complaint  Patient presents with   Hypertension    [name] Dunlap is a 41 y.o. female.  HPI     41 year old female with a history of prediabetes, obesity hypertension on torsemide, labetalol, hydralazine, amlodipine, who presents with concern for elevated blood pressure and chest pain.  Reports her primary care physician has left the practice, and they are working on getting her a new primary care doctors, however in this confusion she did not have her torsemide prescription renewed.  Reports that she has been out of it for about 3 days.  Today her blood pressures were 247/140.  She ended up taking an extra dose of her labetalol.  Feels that her legs have swollen more.  Describes the chest pain as a sharp and pinching pain to the left side of the chest with radiation to the left side of her back which increases with deep breaths.  Does acknowledge some orthopnea, lower extremity swelling.  Denies having a known history of congestive heart failure.  Reports that she has had fluid that she thought was due to her blood pressures that her torsemide helped.  Denies any known fevers, cough, abdominal pain.    Past Medical History:  Diagnosis Date   Complication of anesthesia    With oral surgery and polyp removal required higher dose of anesthetic than anticipated.   Hypertension    Postpartum care following cesarean delivery (8/31) 01/20/2016   Postpartum care following cesarean delivery (9/1) 01/20/2016     Home Medications Prior to Admission medications   Medication Sig Start Date End Date Taking? Authorizing Provider  torsemide (DEMADEX) 20 MG tablet Take 1 tablet (20 mg total) by mouth every other day. 10/06/21  Yes Zadie Rhine, MD  acetaminophen (TYLENOL) 500 MG tablet Take 1,000 mg by mouth every 6 (six) hours as needed for mild pain, moderate pain  or headache.    [provider]  hydrALAZINE (APRESOLINE) 10 MG tablet Take 1 tablet (10 mg total) by mouth every 8 (eight) hours. 01/23/16   Marlinda Mike, CNM  Iron Polysacch Cmplx-B12-FA 150-0.025-1 MG CAPS Take 1 tablet by mouth daily. 01/23/16   Marlinda Mike, CNM  labetalol (NORMODYNE) 200 MG tablet Take 2 tablets (400 mg total) by mouth 3 (three) times daily. 01/23/16   Marlinda Mike, CNM  Magnesium 400 MG TABS Take 400 mg by mouth daily. 01/23/16   Marlinda Mike, CNM  metFORMIN (GLUCOPHAGE-XR) 500 MG 24 hr tablet Take 500 mg by mouth 2 (two) times daily with a meal.    [provider]  methocarbamol (ROBAXIN) 500 MG tablet Take 1 tablet (500 mg total) by mouth 2 (two) times daily. 05/03/18   Robinson, [place] N, PA-C  Prenatal Vit-Fe Fumarate-FA (PRENATAL MULTIVITAMIN) TABS tablet Take 1 tablet by mouth daily.    [provider]      Allergies    Patient has no known allergies.    Review of Systems   Review of Systems  Physical Exam Updated Vital Signs BP (!) 162/83 (BP Location: Right Arm)   Pulse 62   Temp 98.1 F (36.7 C) (Oral)   Resp 17   SpO2 95%  Physical Exam Vitals and nursing note reviewed.  Constitutional:      General: She is not in acute distress.    Appearance: She is well-developed. She is not diaphoretic.  HENT:  Head: Normocephalic and atraumatic.  Eyes:     Conjunctiva/sclera: Conjunctivae normal.  Cardiovascular:     Rate and Rhythm: Normal rate and regular rhythm.     Heart sounds: Normal heart sounds. No murmur heard.   No friction rub. No gallop.  Pulmonary:     Effort: Pulmonary effort is normal. No respiratory distress.     Breath sounds: Normal breath sounds. No wheezing or rales.  Abdominal:     General: There is no distension.     Palpations: Abdomen is soft.     Tenderness: There is no abdominal tenderness. There is no guarding.  Musculoskeletal:        General: No tenderness.     Cervical back: Normal range of  motion.  Skin:    General: Skin is warm and dry.     Findings: No erythema or rash.  Neurological:     Mental Status: She is alert and oriented to person, place, and time.    ED Results / Procedures / Treatments   Labs (all labs ordered are listed, but only abnormal results are displayed) Labs Reviewed  CBC WITH DIFFERENTIAL/PLATELET - Abnormal; Notable for the following components:      Result Value   Lymphs Abs 4.4 (*)    All other components within normal limits  COMPREHENSIVE METABOLIC PANEL - Abnormal; Notable for the following components:   Potassium 3.4 (*)    Calcium 8.8 (*)    All other components within normal limits  BRAIN NATRIURETIC PEPTIDE  PREGNANCY, URINE  TROPONIN I (HIGH SENSITIVITY)  TROPONIN I (HIGH SENSITIVITY)    EKG EKG Interpretation  Date/Time:  Thursday Oct 05 2021 21:47:30 EDT Ventricular Rate:  57 PR Interval:  201 QRS Duration: 93 QT Interval:  413 QTC Calculation: 403 R Axis:   22 Text Interpretation: Sinus rhythm Left atrial enlargement Anteroseptal infarct, old No significant change since last tracing Confirmed by Alvira MondaySchlossman, Mitch Arquette ([id number]) on 10/05/2021 10:10:37 PM  Radiology CT Angio Chest/Abd/Pel for Dissection W and/or Wo Contrast  Result Date: 10/06/2021 CLINICAL DATA:  Chest pain EXAM: CT ANGIOGRAPHY CHEST, ABDOMEN AND PELVIS TECHNIQUE: Non-contrast CT of the chest was initially obtained. Multidetector CT imaging through the chest, abdomen and pelvis was performed using the standard protocol during bolus administration of intravenous contrast. Multiplanar reconstructed images and MIPs were obtained and reviewed to evaluate the vascular anatomy. RADIATION DOSE REDUCTION: This exam was performed according to the departmental dose-optimization program which includes automated exposure control, adjustment of the mA and/or kV according to patient size and/or use of iterative reconstruction technique. CONTRAST:  125mL OMNIPAQUE IOHEXOL 350 MG/ML  SOLN COMPARISON:  None Available. FINDINGS: CTA CHEST FINDINGS Cardiovascular: --Heart: The heart size is mildly enlarged. There is nopericardial effusion. --Aorta: The course and caliber of the thoracic aorta are normal. There is no aortic atherosclerotic calcification. Precontrast images show no aortic intramural hematoma. There is no blood pool, dissection or penetrating ulcer demonstrated on arterial phase postcontrast imaging. There is a conventional 3 vessel aortic arch branching pattern. The proximal arch vessels are widely patent. Mediastinum/Nodes: No mediastinal, hilar or axillary lymphadenopathy. The visualized thyroid and thoracic esophageal course are unremarkable. Lungs/Pleura: No pulmonary nodules or masses. No pleural effusion or pneumothorax. No focal airspace consolidation. No focal pleural abnormality. Musculoskeletal: No chest wall abnormality. No acute osseous findings. Review of the MIP images confirms the above findings. CTA ABDOMEN AND PELVIS FINDINGS VASCULAR Aorta: Normal caliber aorta without aneurysm, dissection, vasculitis or hemodynamically significant stenosis. There is no  aortic atherosclerosis. Celiac: No aneurysm, dissection or hemodynamically significant stenosis. Normal branching pattern. SMA: Widely patent without dissection or stenosis. Renals: Single renal arteries bilaterally. No aneurysm, dissection, stenosis or evidence of fibromuscular dysplasia. IMA: Patent without abnormality. Inflow: No aneurysm, stenosis or dissection. Veins: Normal course and caliber of the major veins. Assessment is otherwise limited by the arterial dominant contrast phase. Review of the MIP images confirms the above findings. NON-VASCULAR Hepatobiliary: Normal hepatic contours and density. No visible biliary dilatation. Normal gallbladder. Pancreas: Normal contours without ductal dilatation. No peripancreatic fluid collection. Spleen: Normal arterial phase splenic enhancement pattern.  Adrenals/Urinary Tract: --Adrenal glands: Normal. --Right kidney/ureter: No hydronephrosis or perinephric stranding. No nephrolithiasis. No obstructing ureteral stones. --Left kidney/ureter: No hydronephrosis or perinephric stranding. No nephrolithiasis. No obstructing ureteral stones. --Urinary bladder: Unremarkable. Stomach/Bowel: --Stomach/Duodenum: No hiatal hernia or other gastric abnormality. Normal duodenal course and caliber. --Small bowel: No dilatation or inflammation. --Colon: No focal abnormality. --Appendix: Normal. Lymphatic:  No abdominal or pelvic lymphadenopathy. Reproductive: Calcified fibroid at the uterine fundus. There is a T-shaped contraceptive device within the uterus. Musculoskeletal. No bony spinal canal stenosis or focal osseous abnormality. Other: None. Review of the MIP images confirms the above findings. IMPRESSION: 1. No acute aortic syndrome. 2. Mild cardiomegaly. 3. No acute abnormality of the chest, abdomen or pelvis. Electronically Signed   By: Deatra Robinson M.D.   On: 10/06/2021 01:07    Procedures Procedures    Medications Ordered in ED Medications  iohexol (OMNIPAQUE) 350 MG/ML injection 125 mL (125 mLs Intravenous Contrast Given 10/06/21 0017)    ED Course/ Medical Decision Making/ A&P                           Medical Decision Making Amount and/or Complexity of Data Reviewed Labs: ordered. Radiology: ordered.  Risk Prescription drug management.   41 year old female with a history of prediabetes, obesity hypertension on torsemide, labetalol, hydralazine, amlodipine, who presents with concern for elevated blood pressure and chest pain.  Differential diagnosis for chest pain includes pulmonary embolus, dissection, pneumothorax, pneumonia, ACS, myocarditis, pericarditis.  EKG was done and evaluate by me and showed no acute ST changes and no signs of pericarditis.  Have low suspicion for PE given no persisting dyspnea, no asymmetric leg swelling, no other  risk factors.  Overally, suspect symptoms secondary to elevated blood pressures in the setting of no longer having her torsemide.    Given nitroglycerin for pressures.  Ordered CTA dissection study given sharp pain with radiation to back and significant hypertension. Labs including troponin, BNP. CBC, CMP all pending at time of transfer of care.        Final Clinical Impression(s) / ED Diagnoses Final diagnoses:  Primary hypertension  Precordial pain    Rx / DC Orders ED Discharge Orders          Ordered    torsemide (DEMADEX) 20 MG tablet  Every other day        10/06/21 9622              Alvira Monday, MD 10/06/21 1146

## 2021-10-05 NOTE — ED Notes (Signed)
Pt states she was donating plasma (around 1800) and they took her BP and it was 247/140. Pt states she is taking her meds as prescribed (has taken her bp meds today)  but out of her torsemide. When she takes a deep breath she feels a sharp discomfort between her breast and her back.

## 2021-10-05 NOTE — ED Triage Notes (Signed)
Pt with hx of HTN presents today with a self reported BP of 247/140, states she took her Hydralazine and labetalol today. She ran out of her Torsemide, last dose 3 days ago.

## 2021-10-05 NOTE — ED Notes (Signed)
Rt continuing to attempt IV U/S for lab draw and IV access.

## 2021-10-06 ENCOUNTER — Emergency Department (HOSPITAL_BASED_OUTPATIENT_CLINIC_OR_DEPARTMENT_OTHER): Payer: Medicare Other

## 2021-10-06 DIAGNOSIS — I10 Essential (primary) hypertension: Secondary | ICD-10-CM | POA: Diagnosis not present

## 2021-10-06 LAB — TROPONIN I (HIGH SENSITIVITY)
Troponin I (High Sensitivity): 8 ng/L (ref <18)
Troponin I (High Sensitivity): 10 ng/L (ref <18)

## 2021-10-06 LAB — PREGNANCY, URINE: Preg Test, Ur: NEGATIVE

## 2021-10-06 LAB — BRAIN NATRIURETIC PEPTIDE: B Natriuretic Peptide: 39.4 pg/mL (ref 0.0–100.0)

## 2021-10-06 MED ORDER — IOHEXOL 350 MG/ML SOLN
125.0000 mL | Freq: Once | INTRAVENOUS | Status: AC | PRN
Start: 2021-10-06 — End: 2021-10-06
  Administered 2021-10-06: 125 mL via INTRAVENOUS

## 2021-10-06 MED ORDER — TORSEMIDE 20 MG PO TABS: 20.0000 mg | ORAL_TABLET | ORAL | 0 refills | Status: AC

## 2021-10-06 NOTE — ED Notes (Signed)
Pt is in CT scan at this time    Next Troponin level due at 0120, time recalculated with delay/difficulty obtaining lab/iv access.

## 2021-10-06 NOTE — ED Notes (Signed)
ED Provider at bedside. 

## 2021-10-06 NOTE — ED Provider Notes (Signed)
Patient improved.  Vital signs improved. Work-up is unremarkable.  Patient requests prescription for torsemide.  She also request PCP referral.    Zadie Rhine, MD 10/06/21 0246

## 2022-12-11 NOTE — Progress Notes (Unsigned)
NEW PATIENT Date of Service/Encounter:  12/13/22 Referring provider: Zada Finders, PA-C Primary care provider: Woodroe Chen, MD  Subjective:  [name] Dunlap is a 42 y.o. female with a PMHx of cardiomegaly, hypertension, prediabetes, obesity, vitamin D deficiency, hyperinsulinemia, GAD, OSA  presenting today for evaluation of angioedema. History obtained from: chart review and patient.   Angioedema:  She was eating a mediterranean diet around the time reaction occurred, mostly veggies and fruits. She stopped in [place] one day in June and got a salad at 7 PM.  It had some "exotic spices". Her mouth felt tingly when eating this. It was a chicken salad- super green salad. See below for ingredients. She felt her mouth was feeling numb when she was eating, and asked her friends if they felt the same when drinking the seltzer water.   No swelling at this point.  She went to bed around 1 AM. No hives or itching. The following morning, she had difficulty breathing, felt funny. Initially thought she had a stroke because her mouth felt funny and she couldn't talk. When she saw herself in the mirror, her face and lips were really swollen. She was seen in the ED and given IM medications and her symptoms slowly resolved. She associated generalized significant pruritus. Her lips remained dried and cracked for some time.  This lasted for around 2 weeks. Swelling was confined to face and lips.   She ordered this food again about one month later and was able to tolerate it without any worrisome symptoms. She does use lip gloss and make-up but doesn't remember anything different on that day.  She does not have sensitive skin.  She doesn't remember if she has mammalian meat that day.  She does sleep in front a fan and wonders about dust.  She also has mold in her home, particularly in the bathroom.  Ingredients in [place]:    Chart Review:  Reviewed PCP notes from referral 10/24/22: FU ED visit  for allergic reaction, referral [facility] ED visit 10/07/22: lip and tongue swelling, hypertensive while eating in restaurant the night prior, patent airway; tx: IM steroids, benadryl and pepcid.  Other allergy screening: Asthma: no Rhino conjunctivitis: no Food allergy: no Medication allergy: no Hymenoptera allergy: no Urticaria: no Eczema:no   Past Medical History: Past Medical History:  Diagnosis Date   Complication of anesthesia    With oral surgery and polyp removal required higher dose of anesthetic than anticipated.   Hypertension    Postpartum care following cesarean delivery (8/31) 01/20/2016   Postpartum care following cesarean delivery (9/1) 01/20/2016   Medication List:  Current Outpatient Medications  Medication Sig Dispense Refill   acetaminophen (TYLENOL) 500 MG tablet Take 1,000 mg by mouth every 6 (six) hours as needed for mild pain, moderate pain or headache.     amLODipine (NORVASC) 2.5 MG tablet Take 2.5 mg by mouth daily.     EPINEPHrine (EPIPEN 2-PAK) 0.3 mg/0.3 mL IJ SOAJ injection Inject 0.3 mg into the muscle as needed for anaphylaxis. 2 each 2   hydrALAZINE (APRESOLINE) 10 MG tablet Take 1 tablet (10 mg total) by mouth every 8 (eight) hours. 90 tablet 0   Iron Polysacch Cmplx-B12-FA 150-0.025-1 MG CAPS Take 1 tablet by mouth daily. 30 each 0   labetalol (NORMODYNE) 200 MG tablet Take 2 tablets (400 mg total) by mouth 3 (three) times daily. 90 tablet 1   Magnesium 400 MG TABS Take 400 mg by mouth daily. 90 tablet 1  magnesium oxide (MAG-OX) 400 (240 Mg) MG tablet Take 1 tablet by mouth daily.     methocarbamol (ROBAXIN) 500 MG tablet Take 1 tablet (500 mg total) by mouth 2 (two) times daily. 20 tablet 0   metroNIDAZOLE (FLAGYL) 500 MG tablet Take 500 mg by mouth 4 (four) times daily.     metroNIDAZOLE (METROGEL) 0.75 % vaginal gel Place 1 Applicatorful vaginally at bedtime.     Prenatal Vit-Fe Fumarate-FA (PRENATAL MULTIVITAMIN) TABS tablet Take 1 tablet by mouth  daily.     torsemide (DEMADEX) 20 MG tablet Take 1 tablet (20 mg total) by mouth every other day. 10 tablet 0   metFORMIN (GLUCOPHAGE-XR) 500 MG 24 hr tablet Take 500 mg by mouth 2 (two) times daily with a meal. (Patient not taking: Reported on 12/13/2022)     No current facility-administered medications for this visit.   Known Allergies:  No Known Allergies Past Surgical History: Past Surgical History:  Procedure Laterality Date   CESAREAN SECTION N/A 01/19/2016   Procedure: CESAREAN SECTION;  Surgeon: Noland Fordyce, MD;  Location: [facility] BIRTHING SUITES;  Service: Obstetrics;  Laterality: N/A;   FOOT SURGERY Right    arch   TONSILLECTOMY     Family History: History reviewed. No pertinent family history. Social History: Asmi lives in a house built in [place] to her home and she splits time in an apartment built 2016, she is a traveling Engineer, civil (consulting), no water damage in the home, carbon the bedroom, gas and electric heating, no roaches in the home, using dust mite reduction the bedding but not pillows, she works as a Scientist, clinical (histocompatibility and immunogenetics), no HEPA filter in the home.  [place]..   ROS:  All other systems negative except as noted per HPI.  Objective:  Blood pressure 122/84, pulse 63, temperature 97.7 F (36.5 C), temperature source Temporal, resp. rate 18, height 5\' 7"  (1.702 m), weight (!) 363 lb (164.7 kg), SpO2 95%, unknown if currently breastfeeding. Body mass index is 56.85 kg/m. Physical Exam:  General Appearance:  Alert, cooperative, no distress, appears stated age  Head:  Normocephalic, without obvious abnormality, atraumatic  Eyes:  Conjunctiva clear, EOM's intact  Ears Left TM obstructed due to cerumen, right TM partially obstructed due to cerumen, normal ear canals  Nose: Nares normal, hypertrophic turbinates, normal mucosa, and no visible anterior polyps  Throat: Lips, tongue normal; teeth and gums normal, normal posterior oropharynx  Neck: Supple, symmetrical   Lungs:   clear to auscultation bilaterally, Respirations unlabored, no coughing  Heart:  regular rate and rhythm and no murmur, Appears well perfused  Extremities: No edema  Skin: Skin color, texture, turgor normal and no rashes or lesions on visualized portions of skin  Neurologic: No gross deficits   Diagnostics: Skin Testing: Food allergy panel.  And select environmentals Adequate positive and negative controls. Results discussed with patient/family.  Airborne Adult Perc - 12/13/22 1027     Time Antigen Placed 1027    Allergen Manufacturer Waynette Buttery    Location Back    Number of Test 5    36. Alternaria Alternata Negative    37. Cladosporium Herbarum Negative    38. Aspergillus Mix Negative    39. Penicillium Mix Negative    49. Dust Mite Mix Negative    Comments n             Food Adult Perc - 12/13/22 1000     Time Antigen Placed 1023    Allergen Manufacturer Waynette Buttery  Location Back    Number of allergen test 72     Control-buffer 50% Glycerol Negative    Control-Histamine 3+    1. Peanut Negative    2. Soybean Negative    3. Wheat Negative    4. Sesame Negative    5. Milk, Cow Negative    6. Casein Negative    7. Egg White, Chicken Negative    8. Shellfish Mix Negative    9. Fish Mix Negative    10. Cashew Negative    11. Walnut Food Negative    12. Almond Negative    13. Hazelnut Negative    14. Pecan Food Negative    15. Pistachio Negative    16. [place] Nut Negative    17. Coconut Negative    18. Trout Negative    19. Tuna Negative    20. Salmon Negative    21. Flounder Negative    22. Codfish Negative    23. Shrimp Negative    24. Crab Negative    25. Lobster Negative    26. Oyster Negative    27. Scallops Negative    28. Oat  Negative    29. Rice Negative    30. Barley Negative    31. Rye  Negative    32. Hops Negative    33. [place] Meat Negative    34. Chicken Meat Negative    35. Pork Negative    36. Beef Negative    37. Lamb Negative     38. Tomato Negative    39. White Potato Negative    40. Sweet Potato Negative    41. Pea, Green/English Negative    42. Navy Bean Negative    43. Green Beans Negative    44. Squash Negative    45. Green Pepper Negative    46. Mushrooms Negative    47. Onion Negative    48. Avocado Negative    49. Cabbage Negative    50. Carrots Negative    51. Celery Negative    52. Corn Negative    53. Cucumber Negative    54. Grape (White seedless) Negative    55. Orange  Negative    56. Lemon Negative    57. Banana Negative    58. Apple Negative    59. Peach Negative    60. Strawberry Negative    61. Blueberry Negative    62. Cherry Negative    63. Cantaloupe Negative    64. Watermelon Negative    65. Pineapple Negative    66. Chocolate/Cacao Bean Negative    67. Cinnamon Negative    68. Nutmeg Negative    69. Ginger Negative    70. Garlic Negative    71. Pepper, Black Negative    72. Mustard Negative             Allergy testing results were read and interpreted by myself, documented by clinical staff.  Labs:  Lab Orders         Alpha-Gal Panel         Tryptase       Assessment and Plan  Adverse reaction-angioedema with skin itching and peeling - discussed several possibilities including food allergy (somewhat delayed symptoms and duration atypical), alpha gal allergy, irritant/allergic contact dermatitis, mast cell disorders - adult food panel obtained today and negative - environmental allergies to molds and dust mites negative - labs for alpha-gal and tryptase - could consider patch testing in the future if symptoms  continue - will prescribe an epipen to be used in case of concern for respiratory symptom should a repeat reaction occur - if a repeat reaction occurs, please call to schedule a follow-up  Ear Wax:  - buy over the counter debrox ear drops (or similar) and use per package insert to help loosen and remove ear wax, may take several weeks for wax to  dissolve - can use warm water gentle rinses to help dislodge wax after using debrox for a few days  Follow up : 6 months, sooner if needed It was a pleasure meeting you in clinic today! Thank you for allowing me to participate in your care.  This note in its entirety was forwarded to the Provider who requested this consultation.   Thank you for your kind referral. I appreciate the opportunity to take part in [facility] care. Please do not hesitate to contact me with questions.  Sincerely,  Tonny Bollman, MD [facility]

## 2022-12-13 ENCOUNTER — Encounter: Payer: Self-pay | Admitting: Internal Medicine

## 2022-12-13 ENCOUNTER — Ambulatory Visit (INDEPENDENT_AMBULATORY_CARE_PROVIDER_SITE_OTHER): Payer: 59 | Admitting: Internal Medicine

## 2022-12-13 VITALS — BP 122/84 | HR 63 | Temp 97.7°F | Resp 18 | Ht 67.0 in | Wt 363.0 lb

## 2022-12-13 DIAGNOSIS — L272 Dermatitis due to ingested food: Secondary | ICD-10-CM

## 2022-12-13 DIAGNOSIS — R21 Rash and other nonspecific skin eruption: Secondary | ICD-10-CM

## 2022-12-13 DIAGNOSIS — H6122 Impacted cerumen, left ear: Secondary | ICD-10-CM | POA: Diagnosis not present

## 2022-12-13 DIAGNOSIS — T783XXA Angioneurotic edema, initial encounter: Secondary | ICD-10-CM

## 2022-12-13 DIAGNOSIS — T781XXA Other adverse food reactions, not elsewhere classified, initial encounter: Secondary | ICD-10-CM

## 2022-12-13 MED ORDER — EPINEPHRINE 0.3 MG/0.3ML IJ SOAJ: 0.3000 mg | INTRAMUSCULAR | 2 refills | Status: AC | PRN

## 2022-12-13 NOTE — Addendum Note (Signed)
Addended by: Kellie Simmering, Niam Nepomuceno on: 12/13/2022 05:35 PM   Modules accepted: Orders

## 2022-12-13 NOTE — Patient Instructions (Addendum)
Adverse reaction-angioedema with skin itching and peeling - discussed several possibilities including food allergy (somewhat delayed symptoms and duration atypical), alpha gal allergy, irritant/allergic contact dermatitis, mast cell disorders - adult food panel obtained today and negative - environmental allergies to molds and dust mites negative - labs for alpha-gal and tryptase - could consider patch testing in the future if symptoms continue - will prescribe an epipen to be used in case of concern for respiratory symptom should a repeat reaction occur - if a repeat reaction occurs, please call to schedule a follow-up  Ear Wax:  - buy over the counter debrox ear drops (or similar) and use per package insert to help loosen and remove ear wax, may take several weeks for wax to dissolve - can use warm water gentle rinses to help dislodge wax after using debrox for a few days  Follow up : 6 months, sooner if needed It was a pleasure meeting you in clinic today! Thank you for allowing me to participate in your care.  Tonny Bollman, MD [facility]
